# Patient Record
Sex: Female | Born: 1935 | Race: White | Hispanic: No | Marital: Single | Smoking: Never smoker
Health system: Southern US, Community
[De-identification: ages and names within clinical notes are randomized; demographics above are authoritative.]

## PROBLEM LIST (undated history)

## (undated) DIAGNOSIS — I6529 Occlusion and stenosis of unspecified carotid artery: Secondary | ICD-10-CM

## (undated) DIAGNOSIS — M1990 Unspecified osteoarthritis, unspecified site: Secondary | ICD-10-CM

## (undated) DIAGNOSIS — K219 Gastro-esophageal reflux disease without esophagitis: Secondary | ICD-10-CM

## (undated) DIAGNOSIS — R011 Cardiac murmur, unspecified: Secondary | ICD-10-CM

## (undated) DIAGNOSIS — R32 Unspecified urinary incontinence: Secondary | ICD-10-CM

## (undated) DIAGNOSIS — I2510 Atherosclerotic heart disease of native coronary artery without angina pectoris: Secondary | ICD-10-CM

## (undated) DIAGNOSIS — E7800 Pure hypercholesterolemia, unspecified: Secondary | ICD-10-CM

## (undated) DIAGNOSIS — I10 Essential (primary) hypertension: Secondary | ICD-10-CM

## (undated) DIAGNOSIS — Z9289 Personal history of other medical treatment: Secondary | ICD-10-CM

## (undated) DIAGNOSIS — D649 Anemia, unspecified: Secondary | ICD-10-CM

## (undated) HISTORY — PX: TUBAL LIGATION: SHX77

## (undated) HISTORY — PX: PERIPHERAL ARTERIAL STENT GRAFT: SHX2220

---

## 2014-06-16 ENCOUNTER — Emergency Department (HOSPITAL_COMMUNITY): Payer: Medicare Other

## 2014-06-16 ENCOUNTER — Inpatient Hospital Stay (HOSPITAL_COMMUNITY)
Admission: EM | Admit: 2014-06-16 | Discharge: 2014-06-20 | DRG: 312 | Disposition: A | Discharge status: Home / Self Care | Payer: Medicare Other | Attending: Internal Medicine | Admitting: Internal Medicine

## 2014-06-16 ENCOUNTER — Encounter (HOSPITAL_COMMUNITY): Payer: Self-pay | Admitting: Emergency Medicine

## 2014-06-16 DIAGNOSIS — R410 Disorientation, unspecified: Secondary | ICD-10-CM

## 2014-06-16 DIAGNOSIS — W19XXXS Unspecified fall, sequela: Secondary | ICD-10-CM

## 2014-06-16 DIAGNOSIS — D649 Anemia, unspecified: Secondary | ICD-10-CM | POA: Diagnosis present

## 2014-06-16 DIAGNOSIS — R55 Syncope and collapse: Secondary | ICD-10-CM | POA: Diagnosis present

## 2014-06-16 DIAGNOSIS — E7800 Pure hypercholesterolemia, unspecified: Secondary | ICD-10-CM | POA: Diagnosis present

## 2014-06-16 DIAGNOSIS — E049 Nontoxic goiter, unspecified: Secondary | ICD-10-CM

## 2014-06-16 DIAGNOSIS — Z7982 Long term (current) use of aspirin: Secondary | ICD-10-CM | POA: Diagnosis not present

## 2014-06-16 DIAGNOSIS — Z6834 Body mass index (BMI) 34.0-34.9, adult: Secondary | ICD-10-CM

## 2014-06-16 DIAGNOSIS — I2510 Atherosclerotic heart disease of native coronary artery without angina pectoris: Secondary | ICD-10-CM | POA: Diagnosis present

## 2014-06-16 DIAGNOSIS — E785 Hyperlipidemia, unspecified: Secondary | ICD-10-CM | POA: Diagnosis present

## 2014-06-16 DIAGNOSIS — I6529 Occlusion and stenosis of unspecified carotid artery: Secondary | ICD-10-CM | POA: Diagnosis present

## 2014-06-16 DIAGNOSIS — IMO0002 Reserved for concepts with insufficient information to code with codable children: Secondary | ICD-10-CM | POA: Diagnosis present

## 2014-06-16 DIAGNOSIS — I10 Essential (primary) hypertension: Secondary | ICD-10-CM | POA: Diagnosis present

## 2014-06-16 DIAGNOSIS — Z7901 Long term (current) use of anticoagulants: Secondary | ICD-10-CM | POA: Diagnosis not present

## 2014-06-16 DIAGNOSIS — W19XXXA Unspecified fall, initial encounter: Secondary | ICD-10-CM | POA: Insufficient documentation

## 2014-06-16 DIAGNOSIS — K219 Gastro-esophageal reflux disease without esophagitis: Secondary | ICD-10-CM | POA: Diagnosis present

## 2014-06-16 DIAGNOSIS — I779 Disorder of arteries and arterioles, unspecified: Secondary | ICD-10-CM | POA: Diagnosis present

## 2014-06-16 DIAGNOSIS — E041 Nontoxic single thyroid nodule: Secondary | ICD-10-CM | POA: Diagnosis present

## 2014-06-16 DIAGNOSIS — D6489 Other specified anemias: Secondary | ICD-10-CM | POA: Diagnosis present

## 2014-06-16 DIAGNOSIS — R791 Abnormal coagulation profile: Secondary | ICD-10-CM | POA: Diagnosis present

## 2014-06-16 DIAGNOSIS — I739 Peripheral vascular disease, unspecified: Secondary | ICD-10-CM | POA: Diagnosis present

## 2014-06-16 DIAGNOSIS — M129 Arthropathy, unspecified: Secondary | ICD-10-CM | POA: Diagnosis present

## 2014-06-16 DIAGNOSIS — E063 Autoimmune thyroiditis: Secondary | ICD-10-CM

## 2014-06-16 HISTORY — DX: Anemia, unspecified: D64.9

## 2014-06-16 HISTORY — DX: Gastro-esophageal reflux disease without esophagitis: K21.9

## 2014-06-16 HISTORY — DX: Essential (primary) hypertension: I10

## 2014-06-16 HISTORY — DX: Occlusion and stenosis of unspecified carotid artery: I65.29

## 2014-06-16 HISTORY — DX: Unspecified urinary incontinence: R32

## 2014-06-16 HISTORY — DX: Pure hypercholesterolemia, unspecified: E78.00

## 2014-06-16 HISTORY — DX: Unspecified osteoarthritis, unspecified site: M19.90

## 2014-06-16 HISTORY — DX: Atherosclerotic heart disease of native coronary artery without angina pectoris: I25.10

## 2014-06-16 HISTORY — DX: Cardiac murmur, unspecified: R01.1

## 2014-06-16 HISTORY — DX: Personal history of other medical treatment: Z92.89

## 2014-06-16 LAB — CBC
HCT: 35.5 % — ABNORMAL LOW (ref 36.0–46.0)
HEMOGLOBIN: 11.8 g/dL — AB (ref 12.0–15.0)
MCH: 29.2 pg (ref 26.0–34.0)
MCHC: 33.2 g/dL (ref 30.0–36.0)
MCV: 87.9 fL (ref 78.0–100.0)
PLATELETS: 182 10*3/uL (ref 150–400)
RBC: 4.04 MIL/uL (ref 3.87–5.11)
RDW: 14.9 % (ref 11.5–15.5)
WBC: 5.9 10*3/uL (ref 4.0–10.5)

## 2014-06-16 LAB — TSH: TSH: 2.95 u[IU]/mL (ref 0.350–4.500)

## 2014-06-16 LAB — COMPREHENSIVE METABOLIC PANEL
ALBUMIN: 3.8 g/dL (ref 3.5–5.2)
ALK PHOS: 143 U/L — AB (ref 39–117)
ALT: 21 U/L (ref 0–35)
AST: 28 U/L (ref 0–37)
Anion gap: 14 (ref 5–15)
BILIRUBIN TOTAL: 0.4 mg/dL (ref 0.3–1.2)
BUN: 20 mg/dL (ref 6–23)
CHLORIDE: 102 meq/L (ref 96–112)
CO2: 24 mEq/L (ref 19–32)
Calcium: 9.2 mg/dL (ref 8.4–10.5)
Creatinine, Ser: 0.88 mg/dL (ref 0.50–1.10)
GFR calc Af Amer: 72 mL/min — ABNORMAL LOW (ref >90)
GFR calc non Af Amer: 62 mL/min — ABNORMAL LOW (ref >90)
Glucose, Bld: 103 mg/dL — ABNORMAL HIGH (ref 70–99)
POTASSIUM: 3.8 meq/L (ref 3.7–5.3)
SODIUM: 140 meq/L (ref 137–147)
TOTAL PROTEIN: 6.8 g/dL (ref 6.0–8.3)

## 2014-06-16 LAB — DIFFERENTIAL
Basophils Absolute: 0 10*3/uL (ref 0.0–0.1)
Basophils Relative: 0 % (ref 0–1)
EOS ABS: 0.1 10*3/uL (ref 0.0–0.7)
Eosinophils Relative: 2 % (ref 0–5)
LYMPHS ABS: 2.5 10*3/uL (ref 0.7–4.0)
Lymphocytes Relative: 42 % (ref 12–46)
MONOS PCT: 5 % (ref 3–12)
Monocytes Absolute: 0.3 10*3/uL (ref 0.1–1.0)
NEUTROS ABS: 3 10*3/uL (ref 1.7–7.7)
NEUTROS PCT: 51 % (ref 43–77)

## 2014-06-16 LAB — PROTIME-INR
INR: 1.5 — AB (ref 0.00–1.49)
PROTHROMBIN TIME: 18.1 s — AB (ref 11.6–15.2)

## 2014-06-16 LAB — I-STAT TROPONIN, ED: Troponin i, poc: 0.01 ng/mL (ref 0.00–0.08)

## 2014-06-16 LAB — CBG MONITORING, ED: Glucose-Capillary: 96 mg/dL (ref 70–99)

## 2014-06-16 LAB — APTT: APTT: 32 s (ref 24–37)

## 2014-06-16 LAB — TROPONIN I: Troponin I: <0.3 ng/mL (ref <0.30)

## 2014-06-16 MED ORDER — SODIUM CHLORIDE 0.9 % IV SOLN: 250.0000 mL | INTRAVENOUS | Status: DC | PRN

## 2014-06-16 MED ORDER — OMEGA-3-ACID ETHYL ESTERS 1 G PO CAPS
2.0000 g | ORAL_CAPSULE | Freq: Two times a day (BID) | ORAL | Status: DC
  Administered 2014-06-16 – 2014-06-20 (×8): 2 g via ORAL
  Filled 2014-06-16 (×9): qty 2

## 2014-06-16 MED ORDER — ACETAMINOPHEN 650 MG RE SUPP: 650.0000 mg | Freq: Four times a day (QID) | RECTAL | Status: DC | PRN

## 2014-06-16 MED ORDER — CARVEDILOL 12.5 MG PO TABS
12.5000 mg | ORAL_TABLET | Freq: Every day | ORAL | Status: DC
  Administered 2014-06-16 – 2014-06-17 (×2): 12.5 mg via ORAL
  Filled 2014-06-16 (×2): qty 1

## 2014-06-16 MED ORDER — IRBESARTAN 300 MG PO TABS
300.0000 mg | ORAL_TABLET | Freq: Every day | ORAL | Status: DC
  Administered 2014-06-16 – 2014-06-20 (×5): 300 mg via ORAL
  Filled 2014-06-16 (×5): qty 1

## 2014-06-16 MED ORDER — SODIUM CHLORIDE 0.9 % IJ SOLN: 3.0000 mL | INTRAMUSCULAR | Status: DC | PRN

## 2014-06-16 MED ORDER — COLESEVELAM HCL 625 MG PO TABS
1250.0000 mg | ORAL_TABLET | Freq: Every day | ORAL | Status: DC
  Administered 2014-06-16 – 2014-06-20 (×5): 1250 mg via ORAL
  Filled 2014-06-16 (×5): qty 2

## 2014-06-16 MED ORDER — CALCIUM CARBONATE 600 MG PO TABS: 600.0000 mg | ORAL_TABLET | Freq: Every day | ORAL | Status: DC

## 2014-06-16 MED ORDER — ACETAMINOPHEN 325 MG PO TABS
650.0000 mg | ORAL_TABLET | Freq: Four times a day (QID) | ORAL | Status: DC | PRN
  Administered 2014-06-18 – 2014-06-19 (×2): 650 mg via ORAL
  Filled 2014-06-16 (×2): qty 2

## 2014-06-16 MED ORDER — HYDROCHLOROTHIAZIDE 25 MG PO TABS
25.0000 mg | ORAL_TABLET | Freq: Every day | ORAL | Status: DC
  Administered 2014-06-16 – 2014-06-18 (×3): 25 mg via ORAL
  Filled 2014-06-16 (×3): qty 1

## 2014-06-16 MED ORDER — SODIUM CHLORIDE 0.9 % IJ SOLN
3.0000 mL | Freq: Two times a day (BID) | INTRAMUSCULAR | Status: DC
Start: 2014-06-16 — End: 2014-06-20
  Administered 2014-06-16 – 2014-06-18 (×4): 3 mL via INTRAVENOUS

## 2014-06-16 MED ORDER — CALCIUM CARBONATE 1250 (500 CA) MG PO TABS
1.0000 | ORAL_TABLET | Freq: Every day | ORAL | Status: DC
  Administered 2014-06-17 – 2014-06-20 (×4): 500 mg via ORAL
  Filled 2014-06-16 (×5): qty 1

## 2014-06-16 MED ORDER — ALBUTEROL SULFATE (2.5 MG/3ML) 0.083% IN NEBU: 2.5000 mg | INHALATION_SOLUTION | RESPIRATORY_TRACT | Status: DC | PRN

## 2014-06-16 MED ORDER — EZETIMIBE 10 MG PO TABS
10.0000 mg | ORAL_TABLET | Freq: Every day | ORAL | Status: DC
  Administered 2014-06-16 – 2014-06-20 (×5): 10 mg via ORAL
  Filled 2014-06-16 (×5): qty 1

## 2014-06-16 MED ORDER — CYANOCOBALAMIN 500 MCG PO TABS
500.0000 ug | ORAL_TABLET | Freq: Every day | ORAL | Status: DC
  Administered 2014-06-16: 500 ug via ORAL
  Administered 2014-06-17: 11:00:00 via ORAL
  Administered 2014-06-18 – 2014-06-20 (×3): 500 ug via ORAL
  Filled 2014-06-16 (×5): qty 1

## 2014-06-16 MED ORDER — WARFARIN - PHARMACIST DOSING INPATIENT: Freq: Every day | Status: DC

## 2014-06-16 MED ORDER — PANTOPRAZOLE SODIUM 40 MG PO TBEC
40.0000 mg | DELAYED_RELEASE_TABLET | Freq: Every day | ORAL | Status: DC
  Administered 2014-06-16 – 2014-06-20 (×5): 40 mg via ORAL
  Filled 2014-06-16 (×4): qty 1

## 2014-06-16 MED ORDER — ASPIRIN EC 81 MG PO TBEC
81.0000 mg | DELAYED_RELEASE_TABLET | Freq: Every day | ORAL | Status: DC
  Administered 2014-06-16 – 2014-06-20 (×5): 81 mg via ORAL
  Filled 2014-06-16 (×5): qty 1

## 2014-06-16 MED ORDER — SODIUM CHLORIDE 0.9 % IJ SOLN
3.0000 mL | Freq: Two times a day (BID) | INTRAMUSCULAR | Status: DC
  Administered 2014-06-16 – 2014-06-20 (×6): 3 mL via INTRAVENOUS

## 2014-06-16 MED ORDER — VITAMIN B-12 500 MCG PO TABS: 500.0000 ug | ORAL_TABLET | Freq: Every day | ORAL | Status: DC

## 2014-06-16 MED ORDER — WARFARIN SODIUM 10 MG PO TABS
10.0000 mg | ORAL_TABLET | Freq: Once | ORAL | Status: AC
Start: 2014-06-16 — End: 2014-06-16
  Administered 2014-06-16: 10 mg via ORAL
  Filled 2014-06-16: qty 1

## 2014-06-16 MED ORDER — FERROUS SULFATE 325 (65 FE) MG PO TABS
325.0000 mg | ORAL_TABLET | ORAL | Status: DC
  Filled 2014-06-16 (×2): qty 1

## 2014-06-16 MED ORDER — ATORVASTATIN CALCIUM 80 MG PO TABS
80.0000 mg | ORAL_TABLET | Freq: Every day | ORAL | Status: DC
Start: 2014-06-17 — End: 2014-06-20
  Administered 2014-06-17 – 2014-06-20 (×4): 80 mg via ORAL
  Filled 2014-06-16 (×4): qty 1

## 2014-06-16 NOTE — ED Notes (Signed)
C collar remains in place; pt having some difficulty recalling events before fall.

## 2014-06-16 NOTE — ED Notes (Signed)
MD at bedside; pt still having neck tenderness; aspen collar placed per MD request.

## 2014-06-16 NOTE — ED Notes (Signed)
Portable xray at bedside.

## 2014-06-16 NOTE — Code Documentation (Signed)
79[facility] at 1212.  EMS reports that the [place] and walked into a glass door and fell backwards striking her head on the pavement.  On EMS arrival patient was able to answer questions appropriately, but en route patient became confused.  Code stroke activated.  Patient nauseated en route requiring Zofran 4mg  and SBP in the 200s per EMS.  Patient reports that she takes Coumadin.  EDP at bedside, patient to go to trauma room prior to CT.  Code stroke canceled by Dr. Loretha StaplerWofford at 1220.  Bedside handoff with ED RN Lanora ManisElizabeth.

## 2014-06-16 NOTE — ED Provider Notes (Signed)
CSN: [id number]     Arrival date & time 06/16/14  1214 History   First MD Initiated Contact with Patient 06/16/14 1229     Chief Complaint  Patient presents with  . Fall   Ana Chapman is a 79 yo caucasian F who presents today after fall unwitnessed. The patient was brought in by EMS and in route became more confused. Pt is known to be on coumadin. Other medical hx unknown. She endorses HA and neck pain, but no other complaints. Does not recall falling and is unsure what happened. Does not believe she felt odd or ill before the event. Not prone to falls.   She denies CP, SOB, fever, chills, constipation, hematemesis, dysuria, hematuria, sick contacts, or recent travel.   (Consider location/radiation/quality/duration/timing/severity/associated sxs/prior Treatment) Patient is a 79 y.o. female presenting with fall.  Fall This is a new problem. The current episode started today. Associated symptoms include headaches. Pertinent negatives include no abdominal pain, chest pain, chills, coughing, diaphoresis, fever, nausea, numbness, urinary symptoms, vomiting or weakness. Nothing aggravates the symptoms. She has tried nothing for the symptoms.    Past Medical History  Diagnosis Date  . Coronary artery disease   . Hypertension    History reviewed. No pertinent past surgical history. History reviewed. No pertinent family history. History  Substance Use Topics  . Smoking status: Not on file  . Smokeless tobacco: Not on file  . Alcohol Use: Not on file   OB History   Grav Para Term Preterm Abortions TAB SAB Ect Mult Living                 Review of Systems  Unable to perform ROS Constitutional: Negative for fever, chills and diaphoresis.  Respiratory: Negative for cough and shortness of breath.   Cardiovascular: Negative for chest pain, palpitations and leg swelling.  Gastrointestinal: Negative for nausea, vomiting, abdominal pain, diarrhea, constipation and abdominal distention.   Genitourinary: Negative for dysuria, frequency, flank pain and decreased urine volume.  Skin: Negative for wound.  Neurological: Positive for headaches. Negative for dizziness, facial asymmetry, speech difficulty, weakness, light-headedness and numbness.  All other systems reviewed and are negative.     Allergies  Contrast media and Latex  Home Medications   Prior to Admission medications   Medication Sig Start Date End Date Taking? Authorizing Provider  alendronate (FOSAMAX) 70 MG tablet Take 70 mg by mouth once a week. 06/10/14  Yes Historical Provider, MD  aspirin EC 81 MG tablet Take 81 mg by mouth daily.   Yes Historical Provider, MD  calcium carbonate (OS-CAL) 600 MG TABS tablet Take 600 mg by mouth daily.   Yes Historical Provider, MD  carvedilol (COREG) 12.5 MG tablet Take 12.5 mg by mouth daily.   Yes Historical Provider, MD  celecoxib (CELEBREX) 200 MG capsule Take 200 mg by mouth daily. 04/11/14  Yes Historical Provider, MD  colesevelam (WELCHOL) 625 MG tablet Take 1,250 mg by mouth daily.   Yes Historical Provider, MD  CRESTOR 40 MG tablet Take 40 mg by mouth 2 (two) times daily. 06/11/14  Yes Historical Provider, MD  esomeprazole (NEXIUM) 40 MG capsule Take 40 mg by mouth daily at 12 noon.   Yes Historical Provider, MD  ezetimibe (ZETIA) 10 MG tablet Take 10 mg by mouth daily.   Yes Historical Provider, MD  ferrous sulfate 325 (65 FE) MG tablet Take 325 mg by mouth 2 (two) times a week.   Yes Historical Provider, MD  hydrochlorothiazide (HYDRODIURIL) 25  MG tablet Take 25 mg by mouth daily.   Yes Historical Provider, MD  Multiple Vitamin (VITAMIN LIQUID PO) Take 5 mLs by mouth daily.   Yes Historical Provider, MD  olmesartan (BENICAR) 40 MG tablet Take 40 mg by mouth daily.   Yes Historical Provider, MD  omega-3 acid ethyl esters (LOVAZA) 1 G capsule Take 2 g by mouth 2 (two) times daily.   Yes Historical Provider, MD  potassium chloride (K-DUR,KLOR-CON) 10 MEQ tablet Take 10  mEq by mouth 2 (two) times daily.   Yes Historical Provider, MD  vitamin B-12 (CYANOCOBALAMIN) 500 MCG tablet Take 500 mcg by mouth daily.   Yes Historical Provider, MD  Vitamin D, Ergocalciferol, (DRISDOL) 50000 UNITS CAPS capsule Take 50,000 Units by mouth every 7 (seven) days.   Yes Historical Provider, MD  warfarin (COUMADIN) 5 MG tablet Take 5-7.5 mg by mouth daily. Take 5mg  by mouth on Monday and Friday. Take 7.5mg  by mouth on Sunday, Tuesday, Wednesday, Thursday and Saturday.   Yes Historical Provider, MD   BP 180/67  Pulse 69  Temp(Src) 98.2 F (36.8 C) (Oral)  Resp 20  Ht 4\' 8"  (1.422 m)  Wt 152 lb 3.2 oz (69.037 kg)  BMI 34.14 kg/m2  SpO2 97% Physical Exam  Nursing note and vitals reviewed. Constitutional: She appears well-developed and well-nourished. No distress.  HENT:  Head: Normocephalic and atraumatic.  Cardiovascular: Normal rate, regular rhythm, normal heart sounds and intact distal pulses.  Exam reveals no gallop and no friction rub.   No murmur heard. Pulmonary/Chest: Effort normal and breath sounds normal. No respiratory distress. She has no wheezes. She has no rales. She exhibits tenderness (right anterior chest wall, no brusing).  Abdominal: Soft. Bowel sounds are normal. She exhibits no distension and no mass. There is no tenderness. There is no rebound and no guarding.  Lymphadenopathy:    She has no cervical adenopathy.  Neurological: She is alert. No cranial nerve deficit.  Slow to respond, confused  Skin: Skin is warm and dry. No rash noted. She is not diaphoretic. No erythema.    ED Course  Procedures (including critical care time) Labs Review Labs Reviewed  PROTIME-INR - Abnormal; Notable for the following:    Prothrombin Time 18.1 (*)    INR 1.50 (*)    All other components within normal limits  CBC - Abnormal; Notable for the following:    Hemoglobin 11.8 (*)    HCT 35.5 (*)    All other components within normal limits  COMPREHENSIVE METABOLIC  PANEL - Abnormal; Notable for the following:    Glucose, Bld 103 (*)    Alkaline Phosphatase 143 (*)    GFR calc non Af Amer 62 (*)    GFR calc Af Amer 72 (*)    All other components within normal limits  APTT  DIFFERENTIAL  CBG MONITORING, ED  I-STAT TROPOININ, ED    Imaging Review Ct Head Wo Contrast  06/16/2014   CLINICAL DATA:  Post fall, on Coumadin, now with confusion  EXAM: CT HEAD WITHOUT CONTRAST  CT CERVICAL SPINE WITHOUT CONTRAST  TECHNIQUE: Multidetector CT imaging of the head and cervical spine was performed following the standard protocol without intravenous contrast. Multiplanar CT image reconstructions of the cervical spine were also generated.  COMPARISON:  Head CT - 07/10/2008  FINDINGS: CT HEAD FINDINGS  Advanced atrophy with diffuse sulcal prominence and centralized volume loss with mild commensurate ex vacuo dilatation of the ventricular system, likely progressed in the interval.  Scattered minimal periventricular hypodensities compatible with microvascular ischemic disease. Bilateral basal ganglial calcifications. The gray-white differentiation is otherwise well maintained without CT evidence of acute large territory infarct. No intraparenchymal or extra-axial mass or hemorrhage. Normal configuration of the ventricles and basilar cisterns. No midline shift. There is under pneumatization of the bilateral frontal sinuses. The remaining paranasal sinuses and mastoid air cells are normally aerated. No air-fluid levels. Regional soft tissues appear normal. No displaced calvarial fracture.  CT CERVICAL SPINE FINDINGS  C1 to the superior endplate of T3 is imaged.  There is mild (approximately 2 mm) of anterolisthesis of C5 upon C6. The bilateral facets are normally aligned. The dens is normally positioned between the lateral masses of C1. Moderate degenerative change of the atlanto dental articulation. A tiny accessory ossicle is noted about the caudal aspect of the anterior arch of C1.  Normal atlanto-axial articulations. Incidental note is made of partial ossification of the anterior longitudinal ligament anterior to the C2-C3 intervertebral disc space.  No fracture or static subluxation of the cervical spine. Cervical vertebral body heights are preserved. Prevertebral soft tissues are normal.  There is mild to moderate multilevel DDD throughout the cervical spine, likely worse at C4-C5 and C6-C7 with disc space height loss, endplate irregularity and sclerosis.  There is diffuse enlargement of the thyroid gland with a suspected approximately 2.7 x 1.6 cm partially calcified left-sided thyroid nodule (image 54, series 301 as well as an ill-defined approximately 1.9 x 1.8 cm hypo attenuating nodule within the thyroid isthmus (image 61, series 302) and an ill-defined approximately 3.0 x 2.4 cm nodule within the right lobe of the thyroid (image 63, series 302).  Atherosclerotic plaque within the bilateral carotid bulbs, left greater than right. No bulky cervical adenopathy on this noncontrast examination. Limited visualization of lung apices is normal.  IMPRESSION: 1. Advanced atrophy and mild microvascular ischemic disease without acute intracranial process. 2. No fracture or static subluxation of the cervical spine. 3. Mild to moderate multilevel DDD throughout the cervical spine, likely worse at C4-C5 and C6-C7. 4. Diffusely enlarged thyroid gland with multiple bilateral indeterminate thyroid nodules. Further evaluation with nonemergent thyroid ultrasound could be performed as clinically indicated.   Electronically Signed   By: Simonne Come M.D.   On: 06/16/2014 13:34   Ct Cervical Spine Wo Contrast  06/16/2014   CLINICAL DATA:  Post fall, on Coumadin, now with confusion  EXAM: CT HEAD WITHOUT CONTRAST  CT CERVICAL SPINE WITHOUT CONTRAST  TECHNIQUE: Multidetector CT imaging of the head and cervical spine was performed following the standard protocol without intravenous contrast. Multiplanar CT  image reconstructions of the cervical spine were also generated.  COMPARISON:  Head CT - 07/10/2008  FINDINGS: CT HEAD FINDINGS  Advanced atrophy with diffuse sulcal prominence and centralized volume loss with mild commensurate ex vacuo dilatation of the ventricular system, likely progressed in the interval. Scattered minimal periventricular hypodensities compatible with microvascular ischemic disease. Bilateral basal ganglial calcifications. The gray-white differentiation is otherwise well maintained without CT evidence of acute large territory infarct. No intraparenchymal or extra-axial mass or hemorrhage. Normal configuration of the ventricles and basilar cisterns. No midline shift. There is under pneumatization of the bilateral frontal sinuses. The remaining paranasal sinuses and mastoid air cells are normally aerated. No air-fluid levels. Regional soft tissues appear normal. No displaced calvarial fracture.  CT CERVICAL SPINE FINDINGS  C1 to the superior endplate of T3 is imaged.  There is mild (approximately 2 mm) of anterolisthesis of C5 upon C6.  The bilateral facets are normally aligned. The dens is normally positioned between the lateral masses of C1. Moderate degenerative change of the atlanto dental articulation. A tiny accessory ossicle is noted about the caudal aspect of the anterior arch of C1. Normal atlanto-axial articulations. Incidental note is made of partial ossification of the anterior longitudinal ligament anterior to the C2-C3 intervertebral disc space.  No fracture or static subluxation of the cervical spine. Cervical vertebral body heights are preserved. Prevertebral soft tissues are normal.  There is mild to moderate multilevel DDD throughout the cervical spine, likely worse at C4-C5 and C6-C7 with disc space height loss, endplate irregularity and sclerosis.  There is diffuse enlargement of the thyroid gland with a suspected approximately 2.7 x 1.6 cm partially calcified left-sided thyroid  nodule (image 54, series 301 as well as an ill-defined approximately 1.9 x 1.8 cm hypo attenuating nodule within the thyroid isthmus (image 61, series 302) and an ill-defined approximately 3.0 x 2.4 cm nodule within the right lobe of the thyroid (image 63, series 302).  Atherosclerotic plaque within the bilateral carotid bulbs, left greater than right. No bulky cervical adenopathy on this noncontrast examination. Limited visualization of lung apices is normal.  IMPRESSION: 1. Advanced atrophy and mild microvascular ischemic disease without acute intracranial process. 2. No fracture or static subluxation of the cervical spine. 3. Mild to moderate multilevel DDD throughout the cervical spine, likely worse at C4-C5 and C6-C7. 4. Diffusely enlarged thyroid gland with multiple bilateral indeterminate thyroid nodules. Further evaluation with nonemergent thyroid ultrasound could be performed as clinically indicated.   Electronically Signed   By: Simonne Come M.D.   On: 06/16/2014 13:34   Dg Chest Port 1 View  06/16/2014   CLINICAL DATA:  Recent traumatic injury with pain  EXAM: PORTABLE CHEST - 1 VIEW  COMPARISON:  09/17/2008  FINDINGS: The heart size and mediastinal contours are within normal limits. Both lungs are clear. The visualized skeletal structures are unremarkable. An old left clavicular fracture is noted.  IMPRESSION: No active disease.   Electronically Signed   By: Alcide Clever M.D.   On: 06/16/2014 14:29     EKG Interpretation None      MDM   79 yo caucasian F here after fall, on coumadin. Concern for ICH or neck fracture. Pt is in C-collar. Answering most questions, but having a hard time recalling things. VSS, AF, NAD. No external signs of inury. TMs clear bilaterally, no sign of basilar skull fracture. No external skull trauma.  Will obtain head CT WO contrast, C-spine CT, CXR, EKG, CBC, CMP, PT/INR, trop. Trop detectable at 0.01, but no signs of ischemia or arrhythmia on EKG. Therapeutic on  coumadin at 1.5. CXR w/no cardiopulmonary abnormalities or rib fracture. Suspect anterior right chest pain on palpation due to bruising. CT head and C-spine w/no fracture or ICH. However, on attempt to clear collar, pt endorses cervical spine TTP. Therefore, placed in an aspen collar.   At this time, pt will be admitted to hospital. Please see their note for further details regarding the remainder of her hospital course. Throughout her time in the ED, pt remained stable and required no acute interventions.    Final diagnoses:  Fall, initial encounter  Disorientation    Pt was seen under the supervision of Dr. Loretha Stapler.     Rachelle Hora, MD 06/16/14 [phone]

## 2014-06-16 NOTE — ED Notes (Signed)
PT at bus stop; ran into glass door; fell backwards onto head. Initially GCS 15, in route became confused. GCS 14. Pt on coumadin. Denies other injuries.

## 2014-06-16 NOTE — H&P (Signed)
Date: 06/16/2014               Patient Name:  Ana Chapman MRN: [id number]  DOB: [birth year] Age / Sex: 79 y.o., female   PCP: No primary provider on file.              Medical Service: Internal Medicine Teaching Service              Attending Physician: Dr. Judyann Munson, MD    First Contact: Ana Chapman, MS4 Pager: [phone]  Second Contact: Dr. Garald Chapman Pager: [phone]  Third Contact Dr.  Pager: [phone]       After Hours (After 5p/  First Contact Pager: [phone]  weekends / holidays): Second Contact Pager: [phone]   Chief Complaint: Fall  History of Present Illness: Ana Chapman is a 79 yo female with cardiomegaly, 80% left carotid stenosis, and PVD who presented to the ED after a fall. The patient reports losing consciousness before the episode, but EMT told her she fell into the bus door and then backwards hitting her head on the concrete. She remembers being assisted by medics into the ambulance but was confused as to what had happened and where she was. She now reports remembering getting to the bus stop. She states she did not lose bladder control and no one who saw the incident reported jerking movements. She states she drank a diet coke and ate a few potato chips today prior to the event. She denies dizziness, lightheadedness, tunnel vision, and nausea prior to the event. She does report recent palpitations, shortness of breath, and chest tightening/pain, which is usually relieved with rest after 2-3 hours. Patient functions independently and denies similar past episodes.   Upon arrival to ED, CT head w/o contrast performed. No acute intracranial process noted.  Ana Chapman is seen by Ana Lent, PA at [facility]. Unable to obtain records.   Meds: Current Facility-Administered Medications  Medication Dose Route Frequency Provider Last Rate Last Dose  . 0.9 %  sodium chloride infusion  250 mL Intravenous PRN Ky Barban, MD      . acetaminophen (TYLENOL) tablet  650 mg  650 mg Oral Q6H PRN Ky Barban, MD       Or  . acetaminophen (TYLENOL) suppository 650 mg  650 mg Rectal Q6H PRN Ky Barban, MD      . albuterol (PROVENTIL) (2.5 MG/3ML) 0.083% nebulizer solution 2.5 mg  2.5 mg Nebulization Q2H PRN Ky Barban, MD      . aspirin EC tablet 81 mg  81 mg Oral Daily Ky Barban, MD      . Melene Muller ON 06/17/2014] atorvastatin (LIPITOR) tablet 80 mg  80 mg Oral q1800 Ky Barban, MD      . Melene Muller ON 06/17/2014] calcium carbonate (OS-CAL - dosed in mg of elemental calcium) tablet 500 mg of elemental calcium  1 tablet Oral Q breakfast Ana Munson, MD      . carvedilol (COREG) tablet 12.5 mg  12.5 mg Oral Daily Ky Barban, MD      . colesevelam [facility]) tablet 1,250 mg  1,250 mg Oral Daily Ky Barban, MD      . cyanocobalamin tablet 500 mcg  500 mcg Oral Daily Ana Munson, MD      . ezetimibe (ZETIA) tablet 10 mg  10 mg Oral Daily Ky Barban, MD      . Melene Muller ON 06/18/2014] ferrous sulfate tablet 325 mg  325 mg  Oral Once per day on Mon Thu Solianny D Kennerly, MD      . hydrochlorothiazide (HYDRODIURIL) tablet 25 mg  25 mg Oral Daily Ky Barban, MD      . irbesartan (AVAPRO) tablet 300 mg  300 mg Oral Daily Ky Barban, MD      . omega-3 acid ethyl esters (LOVAZA) capsule 2 g  2 g Oral BID Ky Barban, MD      . pantoprazole (PROTONIX) EC tablet 40 mg  40 mg Oral Daily Ky Barban, MD      . sodium chloride 0.9 % injection 3 mL  3 mL Intravenous Q12H Ky Barban, MD      . sodium chloride 0.9 % injection 3 mL  3 mL Intravenous Q12H Ky Barban, MD      . sodium chloride 0.9 % injection 3 mL  3 mL Intravenous PRN Ky Barban, MD        Allergies: Allergies as of 06/16/2014 - Review Complete 06/16/2014  Allergen Reaction Noted  . Contrast media [iodinated diagnostic agents] Itching, Rash, and Other (See Comments) 06/16/2014  . Latex  Itching and Rash 06/16/2014   Past Medical History  Diagnosis Date  . Coronary artery disease   . Hypertension   . Carotid stenosis     "left side 80% blocked"  . High cholesterol   . Heart murmur   . GERD (gastroesophageal reflux disease)   . Anemia   . History of blood transfusion     "related to RLE OR"    Past Surgical History  Procedure Laterality Date  . Peripheral arterial stent graft Bilateral     "RLE was 100% blocked"  . Tubal ligation  ~ 1962   History reviewed. No pertinent family history. History   Social History  . Marital Status: Single    Spouse Name: N/A    Number of Children: N/A  . Years of Education: N/A   Occupational History  . Not on file.   Social History Main Topics  . Smoking status: Non-smoker  . Smokeless tobacco: Tried dip once  . Alcohol Use: None  . Drug Use: None  . Sexual Activity: Not on file   Other Topics Concern  . Not on file   Social History Narrative  . No narrative on file    Review of Systems: Positive: SOB, chest pain, and palpitations Negative: dizziness, vision changes, nausea, lightheadedness  Physical Exam: Blood pressure 180/67, pulse 69, temperature 98.2 F (36.8 C), temperature source Oral, resp. rate 20, height 4\' 8"  (1.422 m), weight 69.037 kg (152 lb 3.2 oz), SpO2 97.00%. Constitutional: She is oriented to person, place, and time. Wearing C-spine, watching TV. HEENT:  Head: Normocephalic, C-spine ENT: Conjunctivae are normal. Pupils are equal, round, and reactive to light. MMM. Cardiovascular: Normal rate, regular rhythm. Systolic heart murmur. No gallops or friction rub. 2+ radial and pedal pulses bilaterally. No JVD. Mid-sternum tender to palpation. Pulmonary/Chest: Effort normal. No respiratory distress. CTAB, no wheezes. Abdominal: Soft. Bowel sounds are normal. No distension or tenderness. Neurological: She is alert and oriented to person, place, and time. CN II-XII grossly intact.  Ext: 5/5 upper  and lower extremity strength. 5/5 grip strength. No peripheral edema.   Lab results: Basic Metabolic Panel:  Recent Labs  16/10/96 1215  NA 140  K 3.8  CL 102  CO2 24  GLUCOSE 103*  BUN 20  CREATININE 0.88  CALCIUM 9.2   Liver Function Tests:  Recent Labs  06/16/14 1215  AST 28  ALT 21  ALKPHOS 143*  BILITOT 0.4  PROT 6.8  ALBUMIN 3.8   CBC:  Recent Labs  06/16/14 1215  WBC 5.9  NEUTROABS 3.0  HGB 11.8*  HCT 35.5*  MCV 87.9  PLT 182   Cardiac Enzymes: No results found for this basename: CKTOTAL, CKMB, CKMBINDEX, TROPONINI,  in the last 72 hours BNP: No results found for this basename: PROBNP,  in the last 72 hours D-Dimer: No results found for this basename: DDIMER,  in the last 72 hours CBG:  Recent Labs  06/16/14 1244  GLUCAP 96   Coagulation:  Recent Labs  06/16/14 1215  LABPROT 18.1*  INR 1.50*   Urine Drug Screen: Drugs of Abuse  No results found for this basename: labopia, cocainscrnur, labbenz, amphetmu, thcu, labbarb    Imaging results:  Ct Head Wo Contrast  06/16/2014   CLINICAL DATA:  Post fall, on Coumadin, now with confusion  EXAM: CT HEAD WITHOUT CONTRAST  CT CERVICAL SPINE WITHOUT CONTRAST  TECHNIQUE: Multidetector CT imaging of the head and cervical spine was performed following the standard protocol without intravenous contrast. Multiplanar CT image reconstructions of the cervical spine were also generated.  COMPARISON:  Head CT - 07/10/2008  FINDINGS: CT HEAD FINDINGS  Advanced atrophy with diffuse sulcal prominence and centralized volume loss with mild commensurate ex vacuo dilatation of the ventricular system, likely progressed in the interval. Scattered minimal periventricular hypodensities compatible with microvascular ischemic disease. Bilateral basal ganglial calcifications. The gray-white differentiation is otherwise well maintained without CT evidence of acute large territory infarct. No intraparenchymal or extra-axial mass  or hemorrhage. Normal configuration of the ventricles and basilar cisterns. No midline shift. There is under pneumatization of the bilateral frontal sinuses. The remaining paranasal sinuses and mastoid air cells are normally aerated. No air-fluid levels. Regional soft tissues appear normal. No displaced calvarial fracture.  CT CERVICAL SPINE FINDINGS  C1 to the superior endplate of T3 is imaged.  There is mild (approximately 2 mm) of anterolisthesis of C5 upon C6. The bilateral facets are normally aligned. The dens is normally positioned between the lateral masses of C1. Moderate degenerative change of the atlanto dental articulation. A tiny accessory ossicle is noted about the caudal aspect of the anterior arch of C1. Normal atlanto-axial articulations. Incidental note is made of partial ossification of the anterior longitudinal ligament anterior to the C2-C3 intervertebral disc space.  No fracture or static subluxation of the cervical spine. Cervical vertebral body heights are preserved. Prevertebral soft tissues are normal.  There is mild to moderate multilevel DDD throughout the cervical spine, likely worse at C4-C5 and C6-C7 with disc space height loss, endplate irregularity and sclerosis.  There is diffuse enlargement of the thyroid gland with a suspected approximately 2.7 x 1.6 cm partially calcified left-sided thyroid nodule (image 54, series 301 as well as an ill-defined approximately 1.9 x 1.8 cm hypo attenuating nodule within the thyroid isthmus (image 61, series 302) and an ill-defined approximately 3.0 x 2.4 cm nodule within the right lobe of the thyroid (image 63, series 302).  Atherosclerotic plaque within the bilateral carotid bulbs, left greater than right. No bulky cervical adenopathy on this noncontrast examination. Limited visualization of lung apices is normal.  IMPRESSION: 1. Advanced atrophy and mild microvascular ischemic disease without acute intracranial process. 2. No fracture or static  subluxation of the cervical spine. 3. Mild to moderate multilevel DDD throughout the cervical spine, likely worse at C4-C5  and C6-C7. 4. Diffusely enlarged thyroid gland with multiple bilateral indeterminate thyroid nodules. Further evaluation with nonemergent thyroid ultrasound could be performed as clinically indicated.   Electronically Signed   By: Simonne Come M.D.   On: 06/16/2014 13:34   Ct Cervical Spine Wo Contrast  06/16/2014   CLINICAL DATA:  Post fall, on Coumadin, now with confusion  EXAM: CT HEAD WITHOUT CONTRAST  CT CERVICAL SPINE WITHOUT CONTRAST  TECHNIQUE: Multidetector CT imaging of the head and cervical spine was performed following the standard protocol without intravenous contrast. Multiplanar CT image reconstructions of the cervical spine were also generated.  COMPARISON:  Head CT - 07/10/2008  FINDINGS: CT HEAD FINDINGS  Advanced atrophy with diffuse sulcal prominence and centralized volume loss with mild commensurate ex vacuo dilatation of the ventricular system, likely progressed in the interval. Scattered minimal periventricular hypodensities compatible with microvascular ischemic disease. Bilateral basal ganglial calcifications. The gray-white differentiation is otherwise well maintained without CT evidence of acute large territory infarct. No intraparenchymal or extra-axial mass or hemorrhage. Normal configuration of the ventricles and basilar cisterns. No midline shift. There is under pneumatization of the bilateral frontal sinuses. The remaining paranasal sinuses and mastoid air cells are normally aerated. No air-fluid levels. Regional soft tissues appear normal. No displaced calvarial fracture.  CT CERVICAL SPINE FINDINGS  C1 to the superior endplate of T3 is imaged.  There is mild (approximately 2 mm) of anterolisthesis of C5 upon C6. The bilateral facets are normally aligned. The dens is normally positioned between the lateral masses of C1. Moderate degenerative change of the  atlanto dental articulation. A tiny accessory ossicle is noted about the caudal aspect of the anterior arch of C1. Normal atlanto-axial articulations. Incidental note is made of partial ossification of the anterior longitudinal ligament anterior to the C2-C3 intervertebral disc space.  No fracture or static subluxation of the cervical spine. Cervical vertebral body heights are preserved. Prevertebral soft tissues are normal.  There is mild to moderate multilevel DDD throughout the cervical spine, likely worse at C4-C5 and C6-C7 with disc space height loss, endplate irregularity and sclerosis.  There is diffuse enlargement of the thyroid gland with a suspected approximately 2.7 x 1.6 cm partially calcified left-sided thyroid nodule (image 54, series 301 as well as an ill-defined approximately 1.9 x 1.8 cm hypo attenuating nodule within the thyroid isthmus (image 61, series 302) and an ill-defined approximately 3.0 x 2.4 cm nodule within the right lobe of the thyroid (image 63, series 302).  Atherosclerotic plaque within the bilateral carotid bulbs, left greater than right. No bulky cervical adenopathy on this noncontrast examination. Limited visualization of lung apices is normal.  IMPRESSION: 1. Advanced atrophy and mild microvascular ischemic disease without acute intracranial process. 2. No fracture or static subluxation of the cervical spine. 3. Mild to moderate multilevel DDD throughout the cervical spine, likely worse at C4-C5 and C6-C7. 4. Diffusely enlarged thyroid gland with multiple bilateral indeterminate thyroid nodules. Further evaluation with nonemergent thyroid ultrasound could be performed as clinically indicated.   Electronically Signed   By: Simonne Come M.D.   On: 06/16/2014 13:34   Dg Chest Port 1 View  06/16/2014   CLINICAL DATA:  Recent traumatic injury with pain  EXAM: PORTABLE CHEST - 1 VIEW  COMPARISON:  09/17/2008  FINDINGS: The heart size and mediastinal contours are within normal limits.  Both lungs are clear. The visualized skeletal structures are unremarkable. An old left clavicular fracture is noted.  IMPRESSION: No active disease.  Electronically Signed   By: Alcide CleverMark  Lukens M.D.   On: 06/16/2014 14:29    Other results: EKG: pending  Assessment & Plan by Problem: Active Problems:   Fall  Ms. Hart RochesterLawson is a 79 yo female with cardiomegaly, 80% carotid stenosis, and PVD admitted after a syncopal event.   Syncope: CT head 06/16/14-no active intracranial process CMP Troponins x 3 Telemetry Orthostatics Daily weights Physical Therapy  Cardiac/Cardiomegaly: Aspirin 81mg  PO Atorvastatin Warfarin 10 mg PO, daily  HTN: 180/67 on admission, continue to monitor Carvedilol 12.5 PO daily Irbesartan 300 mg, PO daily HCTZ 25 mg po  PVD: Warfarin Check INR  Pain:  Tylenol 650mg  PO PRN  FEN/GI: Heart Health Diet  *[facility]   This is a Psychologist, occupationalMedical Student Note.  The care of the patient was discussed with Dr. Garald BraverKennerly and the assessment and plan was formulated with their assistance.  Please see their note for official documentation of the patient encounter.   Signed: Lillia CarmelNida Erianna Jolly, Med Student 06/16/2014, 6:14 PM

## 2014-06-16 NOTE — ED Notes (Signed)
Pt to scanner with RN

## 2014-06-16 NOTE — ED Notes (Signed)
Code stroke called; 1212 patient arrival; 1146 last known well; 1210 stroke team arrived; 1212 neurologist arrived; 1209 phlebotomist arrived; 1220 code stroke canceled.

## 2014-06-16 NOTE — ED Provider Notes (Addendum)
I saw and evaluated the patient, reviewed the resident's note and I agree with the findings and plan.   EKG Interpretation   Date/Time:  Tuesday June 16 2014 15:38:39 EDT Ventricular Rate:  71 PR Interval:  197 QRS Duration: 93 QT Interval:  433 QTC Calculation: 471 R Axis:   -7 Text Interpretation:  Sinus rhythm Left ventricular hypertrophy No old  tracing to compare Confirmed by [facility]  [facility], TREY ([id number]) on 06/17/2014  7:21:07 AM      79 yo female presenting via EMS after a fall, subsequently developing confusion.  Code Stroke called PTA.  However, pt's mechanism was reported to be a mechanical fall, initially normal mental status, then slowly becoming confused.  I cancelled Code Stroke.  Trauma evaluation initiated.  On exam, well appearing, nontoxic, not distressed, normal respiratory effort, normal perfusion, GCS 14 (confused verbal responses), hematoma to posterior scalp, mild TTP of right lateral superior chest wall, abdomen soft and nontender.  Trauma eval in process.  CT head/C spine negative.  Pt appears to have post concussive symptoms.  Plan admit for obs.    Clinical Impression: 1. Fall, initial encounter   2. Disorientation        Merrie RoofJohn David Murray Guzzetta III, MD 06/17/14 0720  Merrie RoofJohn David Aleesia Henney III, MD 06/17/14 [phone]

## 2014-06-16 NOTE — ED Notes (Signed)
CBG 96. 

## 2014-06-16 NOTE — ED Notes (Signed)
Code stroke called off.

## 2014-06-16 NOTE — Progress Notes (Signed)
ANTICOAGULATION CONSULT NOTE - Initial Consult  Pharmacy Consult for coumadin Indication: continuing coumadin from PTA, no stated indication  Allergies  Allergen Reactions  . Contrast Media [Iodinated Diagnostic Agents] Itching, Rash and Other (See Comments)    Skin was peeling   . Latex Itching and Rash    Patient Measurements: Height:  (142.2 cm) Weight: 152 lb 3.2 oz (69.037 kg) IBW/kg (Calculated) : 36.3   Vital Signs: Temp: 98.2 F (36.8 C) (08/04 1705) Temp src: Oral (08/04 1705) BP: 180/67 mmHg (08/04 1705) Pulse Rate: 69 (08/04 1705)  Labs:  Recent Labs  06/16/14 1215  HGB 11.8*  HCT 35.5*  PLT 182  APTT 32  LABPROT 18.1*  INR 1.50*  CREATININE 0.88    Estimated Creatinine Clearance: 41.8 ml/min (by C-G formula based on Cr of 0.88).   Medical History: Past Medical History  Diagnosis Date  . Coronary artery disease   . Hypertension   . Carotid stenosis     "left side 80% blocked"  . High cholesterol   . Heart murmur   . GERD (gastroesophageal reflux disease)   . Anemia   . History of blood transfusion     "related to RLE OR"   . Arthritis     "right knee, ankles; lower back" (06/16/2014)  . Urinary incontinence     Medications:  Prescriptions prior to admission  Medication Sig Dispense Refill  . alendronate (FOSAMAX) 70 MG tablet Take 70 mg by mouth once a week.      Marland Kitchen aspirin EC 81 MG tablet Take 81 mg by mouth daily.      . calcium carbonate (OS-CAL) 600 MG TABS tablet Take 600 mg by mouth daily.      . carvedilol (COREG) 12.5 MG tablet Take 12.5 mg by mouth daily.      . celecoxib (CELEBREX) 200 MG capsule Take 200 mg by mouth daily.      . colesevelam (WELCHOL) 625 MG tablet Take 1,250 mg by mouth daily.      . CRESTOR 40 MG tablet Take 40 mg by mouth 2 (two) times daily.      Marland Kitchen esomeprazole (NEXIUM) 40 MG capsule Take 40 mg by mouth daily at 12 noon.      . ezetimibe (ZETIA) 10 MG tablet Take 10 mg by mouth daily.      . ferrous  sulfate 325 (65 FE) MG tablet Take 325 mg by mouth 2 (two) times a week.      . hydrochlorothiazide (HYDRODIURIL) 25 MG tablet Take 25 mg by mouth daily.      . Multiple Vitamin (VITAMIN LIQUID PO) Take 5 mLs by mouth daily.      Marland Kitchen olmesartan (BENICAR) 40 MG tablet Take 40 mg by mouth daily.      Marland Kitchen omega-3 acid ethyl esters (LOVAZA) 1 G capsule Take 2 g by mouth 2 (two) times daily.      . potassium chloride (K-DUR,KLOR-CON) 10 MEQ tablet Take 10 mEq by mouth 2 (two) times daily.      . vitamin B-12 (CYANOCOBALAMIN) 500 MCG tablet Take 500 mcg by mouth daily.      . Vitamin D, Ergocalciferol, (DRISDOL) 50000 UNITS CAPS capsule Take 50,000 Units by mouth every 7 (seven) days.      Marland Kitchen warfarin (COUMADIN) 5 MG tablet Take 5-7.5 mg by mouth daily. Take  by mouth on Monday and Friday. Take 7.5mg  by mouth on Sunday, Tuesday, Wednesday, Thursday and Saturday.  Assessment: 79 yo F s/p running into a glass door, fell backwards onto head.  Head CT negative for bleed.  Pharmacy consulted to continue home coumadin.  Unclear why she is taking coumadin (most likely afib but no indication with coumadin protocol). Home dose 7.5 mg daily except 5 mg on M,F, last dose 06/15/14. Admission INR is subtherapeutic at 1.5.  Hg 11.8, pltc 182.  Goal of Therapy:  INR 2-3   Plan: Coumadin 10 mg po x 1 dose tonight Daily INR  Herby Abraham, Pharm.D. [phone] 06/16/2014 6:33 PM

## 2014-06-16 NOTE — ED Notes (Signed)
Cancelled Code Stroke per Dr. Loretha StaplerWofford

## 2014-06-16 NOTE — H&P (Signed)
Date: 06/16/2014               Patient Name:  Ana Chapman MRN: [id number]  DOB: [birth year] Age / Sex: 79 y.o., female   PCP: Darryl Lent, PA-C         Medical Service: Internal Medicine Teaching Service         Attending Physician: Dr. Judyann Munson, MD    First Contact: Lillia Carmel, MS4 Pager: [phone]  Second Contact: Dr. Garald Braver Pager: [phone]       After Hours (After 5p/  First Contact Pager: [phone]  weekends / holidays): Second Contact Pager: [phone]   Chief Complaint: Fall  History of Present Illness:  Ana Chapman is a 79 year old woman with PMH significant for cardiomegaly, 80% left carotid artery stenosis, and PVD s/p stent placement, Chapman warfarin, who presented to the ED after a fall. She reports losing consciousness before the episode and does not recall the events preceding her fall. Per EMT notes, she was seen falling into the bus door and then backwards hitting her head Chapman the concrete. She remembers being assisted by medics into the ambulance but was confused. She reports skipping breakfast this morning as she does most morning for lack of time. She had spend at least two hours Chapman the bus ride and getting to the social security office before this event and had been in her usual state of health. She denies loss of bowel/bladder control, tongue biting, history of seizures, or convulsions. She denies dizziness, lightheadedness, tunnel vision, and nausea prior to the event. She does report recent palpitations, shortness of breath, and chest tightening/pain, which is usually relieved with rest after 2-3 hours. She is independent of her ADLs and lives alone.  In the ED, CT head w/o contrast was negative for intracranial bleed, CT cervical spine was negative for fracture but she remained in a C collar as she continued to complain of neck pain.     Medications Prior to Admission  Medication Sig Dispense Refill  . alendronate (FOSAMAX) 70 MG tablet Take 70 mg by mouth once a week.       Marland Kitchen aspirin EC 81 MG tablet Take 81 mg by mouth daily.      . calcium carbonate (OS-CAL) 600 MG TABS tablet Take 600 mg by mouth daily.      . carvedilol (COREG) 12.5 MG tablet Take 12.5 mg by mouth daily.      . celecoxib (CELEBREX) 200 MG capsule Take 200 mg by mouth daily.      . colesevelam (WELCHOL) 625 MG tablet Take 1,250 mg by mouth daily.      . CRESTOR 40 MG tablet Take 40 mg by mouth 2 (two) times daily.      Marland Kitchen esomeprazole (NEXIUM) 40 MG capsule Take 40 mg by mouth daily at 12 noon.      . ezetimibe (ZETIA) 10 MG tablet Take 10 mg by mouth daily.      . ferrous sulfate 325 (65 FE) MG tablet Take 325 mg by mouth 2 (two) times a week.      . hydrochlorothiazide (HYDRODIURIL) 25 MG tablet Take 25 mg by mouth daily.      . Multiple Vitamin (VITAMIN LIQUID PO) Take 5 mLs by mouth daily.      Marland Kitchen olmesartan (BENICAR) 40 MG tablet Take 40 mg by mouth daily.      Marland Kitchen omega-3 acid ethyl esters (LOVAZA) 1 G capsule Take 2 g by mouth 2 (two)  times daily.      . potassium chloride (K-DUR,KLOR-CON) 10 MEQ tablet Take 10 mEq by mouth 2 (two) times daily.      . vitamin B-12 (CYANOCOBALAMIN) 500 MCG tablet Take 500 mcg by mouth daily.      . Vitamin D, Ergocalciferol, (DRISDOL) 50000 UNITS CAPS capsule Take 50,000 Units by mouth every 7 (seven) days.      Marland Kitchen warfarin (COUMADIN) 5 MG tablet Take 5-7.5 mg by mouth daily. Take 5mg  by mouth Chapman Monday and Friday. Take 7.5mg  by mouth Chapman Sunday, Tuesday, Wednesday, Thursday and Saturday.        Meds: Current Facility-Administered Medications  Medication Dose Route Frequency Provider Last Rate Last Dose  . 0.9 %  sodium chloride infusion  250 mL Intravenous PRN Ky Barban, MD      . acetaminophen (TYLENOL) tablet 650 mg  650 mg Oral Q6H PRN Ky Barban, MD       Or  . acetaminophen (TYLENOL) suppository 650 mg  650 mg Rectal Q6H PRN Ky Barban, MD      . albuterol (PROVENTIL) (2.5 MG/3ML) 0.083% nebulizer solution 2.5 mg  2.5  mg Nebulization Q2H PRN Ky Barban, MD      . aspirin EC tablet 81 mg  81 mg Oral Daily Ky Barban, MD      . Ana Chapman 06/17/2014] atorvastatin (LIPITOR) tablet 80 mg  80 mg Oral q1800 Ky Barban, MD      . Ana Chapman 06/17/2014] calcium carbonate (OS-CAL - dosed in mg of elemental calcium) tablet 500 mg of elemental calcium  1 tablet Oral Q breakfast Judyann Munson, MD      . carvedilol (COREG) tablet 12.5 mg  12.5 mg Oral Daily Ky Barban, MD      . colesevelam [facility]) tablet 1,250 mg  1,250 mg Oral Daily Ky Barban, MD      . cyanocobalamin tablet 500 mcg  500 mcg Oral Daily Judyann Munson, MD      . ezetimibe (ZETIA) tablet 10 mg  10 mg Oral Daily Ky Barban, MD      . Ana Chapman 06/18/2014] ferrous sulfate tablet 325 mg  325 mg Oral Once per day Chapman Mon Thu Ana Almas D Ojani Berenson, MD      . hydrochlorothiazide (HYDRODIURIL) tablet 25 mg  25 mg Oral Daily Ky Barban, MD      . irbesartan (AVAPRO) tablet 300 mg  300 mg Oral Daily Ky Barban, MD      . omega-3 acid ethyl esters (LOVAZA) capsule 2 g  2 g Oral BID Ky Barban, MD      . pantoprazole (PROTONIX) EC tablet 40 mg  40 mg Oral Daily Ky Barban, MD      . sodium chloride 0.9 % injection 3 mL  3 mL Intravenous Q12H Ky Barban, MD      . sodium chloride 0.9 % injection 3 mL  3 mL Intravenous Q12H Ky Barban, MD      . sodium chloride 0.9 % injection 3 mL  3 mL Intravenous PRN Ky Barban, MD      . warfarin (COUMADIN) tablet 10 mg  10 mg Oral Once Ana Chapman, RPH      . [START Chapman 06/17/2014] Warfarin - Pharmacist Dosing Inpatient   Does not apply q1800 Ana Chapman, [facility]        Allergies: Allergies as of 06/16/2014 - Review  Complete 06/16/2014  Allergen Reaction Noted  . Contrast media [iodinated diagnostic agents] Itching, Rash, and Other (See Comments) 06/16/2014  . Latex Itching and Rash 06/16/2014   Past Medical History   Diagnosis Date  . Coronary artery disease   . Hypertension   . Carotid stenosis     "left side 80% blocked"  . High cholesterol   . Heart murmur   . GERD (gastroesophageal reflux disease)   . Anemia   . History of blood transfusion     "related to RLE OR"   . Arthritis     "right knee, ankles; lower back" (06/16/2014)  . Urinary incontinence    Past Surgical History  Procedure Laterality Date  . Peripheral arterial stent graft Bilateral     "RLE was 100% blocked"  . Tubal ligation  ~ 1962   History reviewed. No pertinent family history. History   Social History  . Marital Status: Single    Spouse Name: N/A    Number of Children: N/A  . Years of Education: N/A   Occupational History  . Not Chapman file.   Social History Main Topics  . Smoking status: Never Smoker   . Smokeless tobacco: Never Used  . Alcohol Use: No  . Drug Use: No  . Sexual Activity: No   Other Topics Concern  . Not Chapman file   Social History Narrative  . No narrative Chapman file    Review of Systems: Review of Systems  Constitutional: Negative for fever, chills, weight loss, malaise/fatigue and diaphoresis.  Eyes: Negative for blurred vision.  Respiratory: Negative for cough and shortness of breath.   Cardiovascular: Negative for chest pain and palpitations.  Gastrointestinal: Negative for nausea and vomiting.  Genitourinary: Negative for dysuria, frequency and hematuria.       Has chronic urinary incontinence   Musculoskeletal: Positive for neck pain.  Skin: Negative for rash.  Neurological: Positive for loss of consciousness. Negative for dizziness, tremors, weakness and headaches.  Psychiatric/Behavioral: Negative for depression.    Physical Exam: Blood pressure 180/67, pulse 69, temperature 98.2 F (36.8 C), temperature source Oral, resp. rate 20, height 4\' 8"  (1.422 m), weight 152 lb 3.2 oz (69.037 kg), SpO2 97.00%. Physical Exam  Nursing note and vitals reviewed. Constitutional: She is  oriented to person, place, and time. She appears well-developed and well-nourished.  HENT:  Head: Normocephalic.  C-collar in place   Cardiovascular: Normal rate and regular rhythm.   Murmur heard. SEM heart best at the RLSB PD pulses present but diminished bilaterally   Respiratory: Effort normal and breath sounds normal. No respiratory distress. She has no wheezes. She has no rales.  GI: Soft. Bowel sounds are normal. She exhibits no distension. There is no tenderness. There is no rebound and no guarding.  Musculoskeletal: She exhibits no edema and no tenderness.  Neurological: She is alert and oriented to person, place, and time.  Skin: Skin is warm and dry. No erythema.  Psychiatric: She has a normal mood and affect. Her behavior is normal.     Lab results: Basic Metabolic Panel:  Recent Labs  16/10/96 1215  NA 140  K 3.8  CL 102  CO2 24  GLUCOSE 103*  BUN 20  CREATININE 0.88  CALCIUM 9.2   Liver Function Tests:  Recent Labs  06/16/14 1215  AST 28  ALT 21  ALKPHOS 143*  BILITOT 0.4  PROT 6.8  ALBUMIN 3.8   CBC:  Recent Labs  06/16/14 1215  WBC 5.9  NEUTROABS 3.0  HGB 11.8*  HCT 35.5*  MCV 87.9  PLT 182   CBG:  Recent Labs  06/16/14 1244  GLUCAP 96   Coagulation:  Recent Labs  06/16/14 1215  LABPROT 18.1*  INR 1.50*    Imaging results:  Ct Head Wo Contrast  06/16/2014   CLINICAL DATA:  Post fall, Chapman Coumadin, now with confusion  EXAM: CT HEAD WITHOUT CONTRAST  CT CERVICAL SPINE WITHOUT CONTRAST  TECHNIQUE: Multidetector CT imaging of the head and cervical spine was performed following the standard protocol without intravenous contrast. Multiplanar CT image reconstructions of the cervical spine were also generated.  COMPARISON:  Head CT - 07/10/2008  FINDINGS: CT HEAD FINDINGS  Advanced atrophy with diffuse sulcal prominence and centralized volume loss with mild commensurate ex vacuo dilatation of the ventricular system, likely progressed  in the interval. Scattered minimal periventricular hypodensities compatible with microvascular ischemic disease. Bilateral basal ganglial calcifications. The gray-white differentiation is otherwise well maintained without CT evidence of acute large territory infarct. No intraparenchymal or extra-axial mass or hemorrhage. Normal configuration of the ventricles and basilar cisterns. No midline shift. There is under pneumatization of the bilateral frontal sinuses. The remaining paranasal sinuses and mastoid air cells are normally aerated. No air-fluid levels. Regional soft tissues appear normal. No displaced calvarial fracture.  CT CERVICAL SPINE FINDINGS  C1 to the superior endplate of T3 is imaged.  There is mild (approximately 2 mm) of anterolisthesis of C5 upon C6. The bilateral facets are normally aligned. The dens is normally positioned between the lateral masses of C1. Moderate degenerative change of the atlanto dental articulation. A tiny accessory ossicle is noted about the caudal aspect of the anterior arch of C1. Normal atlanto-axial articulations. Incidental note is made of partial ossification of the anterior longitudinal ligament anterior to the C2-C3 intervertebral disc space.  No fracture or static subluxation of the cervical spine. Cervical vertebral body heights are preserved. Prevertebral soft tissues are normal.  There is mild to moderate multilevel DDD throughout the cervical spine, likely worse at C4-C5 and C6-C7 with disc space height loss, endplate irregularity and sclerosis.  There is diffuse enlargement of the thyroid gland with a suspected approximately 2.7 x 1.6 cm partially calcified left-sided thyroid nodule (image 54, series 301 as well as an ill-defined approximately 1.9 x 1.8 cm hypo attenuating nodule within the thyroid isthmus (image 61, series 302) and an ill-defined approximately 3.0 x 2.4 cm nodule within the right lobe of the thyroid (image 63, series 302).  Atherosclerotic plaque  within the bilateral carotid bulbs, left greater than right. No bulky cervical adenopathy Chapman this noncontrast examination. Limited visualization of lung apices is normal.  IMPRESSION: 1. Advanced atrophy and mild microvascular ischemic disease without acute intracranial process. 2. No fracture or static subluxation of the cervical spine. 3. Mild to moderate multilevel DDD throughout the cervical spine, likely worse at C4-C5 and C6-C7. 4. Diffusely enlarged thyroid gland with multiple bilateral indeterminate thyroid nodules. Further evaluation with nonemergent thyroid ultrasound could be performed as clinically indicated.   Electronically Signed   By: Simonne Come M.D.   Chapman: 06/16/2014 13:34   Ct Cervical Spine Wo Contrast  06/16/2014   CLINICAL DATA:  Post fall, Chapman Coumadin, now with confusion  EXAM: CT HEAD WITHOUT CONTRAST  CT CERVICAL SPINE WITHOUT CONTRAST  TECHNIQUE: Multidetector CT imaging of the head and cervical spine was performed following the standard protocol without intravenous contrast. Multiplanar CT image reconstructions of the cervical spine were  also generated.  COMPARISON:  Head CT - 07/10/2008  FINDINGS: CT HEAD FINDINGS  Advanced atrophy with diffuse sulcal prominence and centralized volume loss with mild commensurate ex vacuo dilatation of the ventricular system, likely progressed in the interval. Scattered minimal periventricular hypodensities compatible with microvascular ischemic disease. Bilateral basal ganglial calcifications. The gray-white differentiation is otherwise well maintained without CT evidence of acute large territory infarct. No intraparenchymal or extra-axial mass or hemorrhage. Normal configuration of the ventricles and basilar cisterns. No midline shift. There is under pneumatization of the bilateral frontal sinuses. The remaining paranasal sinuses and mastoid air cells are normally aerated. No air-fluid levels. Regional soft tissues appear normal. No displaced calvarial  fracture.  CT CERVICAL SPINE FINDINGS  C1 to the superior endplate of T3 is imaged.  There is mild (approximately 2 mm) of anterolisthesis of C5 upon C6. The bilateral facets are normally aligned. The dens is normally positioned between the lateral masses of C1. Moderate degenerative change of the atlanto dental articulation. A tiny accessory ossicle is noted about the caudal aspect of the anterior arch of C1. Normal atlanto-axial articulations. Incidental note is made of partial ossification of the anterior longitudinal ligament anterior to the C2-C3 intervertebral disc space.  No fracture or static subluxation of the cervical spine. Cervical vertebral body heights are preserved. Prevertebral soft tissues are normal.  There is mild to moderate multilevel DDD throughout the cervical spine, likely worse at C4-C5 and C6-C7 with disc space height loss, endplate irregularity and sclerosis.  There is diffuse enlargement of the thyroid gland with a suspected approximately 2.7 x 1.6 cm partially calcified left-sided thyroid nodule (image 54, series 301 as well as an ill-defined approximately 1.9 x 1.8 cm hypo attenuating nodule within the thyroid isthmus (image 61, series 302) and an ill-defined approximately 3.0 x 2.4 cm nodule within the right lobe of the thyroid (image 63, series 302).  Atherosclerotic plaque within the bilateral carotid bulbs, left greater than right. No bulky cervical adenopathy Chapman this noncontrast examination. Limited visualization of lung apices is normal.  IMPRESSION: 1. Advanced atrophy and mild microvascular ischemic disease without acute intracranial process. 2. No fracture or static subluxation of the cervical spine. 3. Mild to moderate multilevel DDD throughout the cervical spine, likely worse at C4-C5 and C6-C7. 4. Diffusely enlarged thyroid gland with multiple bilateral indeterminate thyroid nodules. Further evaluation with nonemergent thyroid ultrasound could be performed as clinically  indicated.   Electronically Signed   By: Simonne Come M.D.   Chapman: 06/16/2014 13:34   Dg Chest Port 1 View  06/16/2014   CLINICAL DATA:  Recent traumatic injury with pain  EXAM: PORTABLE CHEST - 1 VIEW  COMPARISON:  09/17/2008  FINDINGS: The heart size and mediastinal contours are within normal limits. Both lungs are clear. The visualized skeletal structures are unremarkable. An old left clavicular fracture is noted.  IMPRESSION: No active disease.   Electronically Signed   By: Alcide Clever M.D.   Chapman: 06/16/2014 14:29    Other results: ED ECG REPORT   Date: 06/16/2014  EKG Time: 15:38  Rate: 71  Rhythm: normal sinus rhythm,    Axis: normal  Intervals:normal  ST&T Change: TWI in lead III  Narrative Interpretation: no previous tracing to compare    Assessment & Plan by Problem: 79 yo female with cardiomegaly, 80% carotid stenosis, and PVD admitted after a syncopal event.  Syncope: Unclear etiology. Differential broad to include cardiac arrhythmia (she has cardiomegaly and hx of cardiac disease), medication  induced, hypoglycemic event (patient missed breakfast), worsening carotid artery disease (has 80% stenosis in left carotid artery), autonomic dysfunction. She has no Neurologic findings with CT head negative for intracranial process, seizure unlikely given no hx of seizure, no seizure-like activity and no loss of bowel/bladder control.  -Admit to telemetry -Orthostatic vitals -PT eval -troponin q6 h x3 -Fall precaution -Obtain records from her Cardiologist  Fall: C-spine CT negative for fracture. -Continue C-collar for comfort -Tylenol PRN for pain -PT  Thyroid goiter: CT cervical spine ordered today with diffusely enlarged thyroid gland with multiple bilateral  indeterminate thyroid nodules.  -Ordered TSH -Consider thyroid US  Cardiomegaly: Will continue her home ASA 81mg  daily, BB, and statin.  -[facility]  HTN: BP initially elevated to  180/67 but she does not recall taking her antihypertensives this morning.  -Continue home Carvedilol 12.5 PO daily  -Continue home Irbesartan 300 mg, PO dai -Continue HCTZ 25mg  daily   -PAD/PVD: s/p stent per her report. Chapman warfarin, INR sub therapeutic at 1.5 -Obtain records from her PCP -Continue warfarin per pharmacy  Chronic anemia- She reports chronic anemia. Hg of 11.8 which is almost wnl.  -Obtain records from her PCP for baseline Hg.   FEN:  NSL Heart Health Diet  DVT prophylaxis: SCDs, warfarin  Dispo: Disposition is deferred at this time, awaiting improvement of current medical problems. Anticipated discharge in approximately 1-2 day(s).   The patient does have a current PCP Darryl Lent(Amanda Taylor, PA-C) and [facility] after discharge.  The patient does not have transportation limitations that hinder transportation to clinic appointments.  Signed: Ky BarbanSolianny D Norvell Ureste, MD 06/16/2014, 7:38 PM

## 2014-06-16 NOTE — H&P (Signed)
  I have seen and examined the patient myself, and I have reviewed the note by Lillia CarmelNida Waheed, MS 4 and was present during the interview and physical exam.  Please see my separate H&P for additional findings, assessment, and plan.   Signed: Ky BarbanSolianny D Aleshka Corney, MD 06/16/2014, 8:13 PM

## 2014-06-17 ENCOUNTER — Inpatient Hospital Stay (HOSPITAL_COMMUNITY): Payer: Medicare Other

## 2014-06-17 DIAGNOSIS — I10 Essential (primary) hypertension: Secondary | ICD-10-CM

## 2014-06-17 DIAGNOSIS — I517 Cardiomegaly: Secondary | ICD-10-CM

## 2014-06-17 DIAGNOSIS — R55 Syncope and collapse: Principal | ICD-10-CM

## 2014-06-17 LAB — COMPREHENSIVE METABOLIC PANEL
ALK PHOS: 120 U/L — AB (ref 39–117)
ALT: 14 U/L (ref 0–35)
AST: 20 U/L (ref 0–37)
Albumin: 3.3 g/dL — ABNORMAL LOW (ref 3.5–5.2)
Anion gap: 14 (ref 5–15)
BUN: 18 mg/dL (ref 6–23)
CO2: 24 meq/L (ref 19–32)
Calcium: 9 mg/dL (ref 8.4–10.5)
Chloride: 104 mEq/L (ref 96–112)
Creatinine, Ser: 0.88 mg/dL (ref 0.50–1.10)
GFR, EST AFRICAN AMERICAN: 72 mL/min — AB (ref >90)
GFR, EST NON AFRICAN AMERICAN: 62 mL/min — AB (ref >90)
GLUCOSE: 100 mg/dL — AB (ref 70–99)
POTASSIUM: 3.7 meq/L (ref 3.7–5.3)
SODIUM: 142 meq/L (ref 137–147)
Total Bilirubin: 0.5 mg/dL (ref 0.3–1.2)
Total Protein: 6.1 g/dL (ref 6.0–8.3)

## 2014-06-17 LAB — PROTIME-INR
INR: 1.3 (ref 0.00–1.49)
Prothrombin Time: 16.2 seconds — ABNORMAL HIGH (ref 11.6–15.2)

## 2014-06-17 LAB — TROPONIN I: Troponin I: <0.3 ng/mL (ref <0.30)

## 2014-06-17 MED ORDER — CARVEDILOL 3.125 MG PO TABS
3.1250 mg | ORAL_TABLET | Freq: Two times a day (BID) | ORAL | Status: DC
  Administered 2014-06-17 – 2014-06-20 (×7): 3.125 mg via ORAL
  Filled 2014-06-17 (×8): qty 1

## 2014-06-17 MED ORDER — REGADENOSON 0.4 MG/5ML IV SOLN
0.4000 mg | Freq: Once | INTRAVENOUS | Status: AC
  Administered 2014-06-18: 0.4 mg via INTRAVENOUS
  Filled 2014-06-17: qty 5

## 2014-06-17 MED ORDER — WARFARIN SODIUM 7.5 MG PO TABS
7.5000 mg | ORAL_TABLET | Freq: Once | ORAL | Status: AC
  Administered 2014-06-17: 7.5 mg via ORAL
  Filled 2014-06-17: qty 1

## 2014-06-17 NOTE — Progress Notes (Addendum)
I have seen the patient and reviewed the daily progress note by Lillia CarmelNida Waheed MS 4 and discussed the care of the patient with them.  See below for documentation of my findings, assessment, and plans.  Subjective: No overnight events. She worked with PT today and felt no dizziness, very steady on her feet. Her neck is still tender and she remains with imobilizing C-collar in place. No chest pain, SOB, or palpitations.   Objective: Vital signs in last 24 hours: Filed Vitals:   06/16/14 2005 06/17/14 0517 06/17/14 1050 06/17/14 1320  BP: 176/82 168/78 142/70 164/70  Pulse: 82 74 67 65  Temp:  98.4 F (36.9 C) 98.5 F (36.9 C) 98.2 F (36.8 C)  TempSrc:  Oral Oral Oral  Resp:   18 18  Height:      Weight:  152 lb 3.2 oz (69.037 kg)    SpO2: 98% 95% 96% 98%   Weight change:   Intake/Output Summary (Last 24 hours) at 06/17/14 1556 Last data filed at 06/17/14 1300  Gross per 24 hour  Intake    422 ml  Output      0 ml  Net    422 ml   Vitals reviewed. General: resting in bed, in NAD HEENT: PERRL, EOMI, no scleral icterus, dry MM. Edentulous, mild dysarthria.  Immobilizing C-collar in place. Thyroid gland not examined 22/ to C-collar.  Cardiac: RRR, no rubs, murmurs or gallops. PD pulsed diminished bilaterally.  Pulm: clear to auscultation bilaterally, no wheezes, rales, or rhonchi Abd: soft, nontender, nondistended, BS present Ext: warm and well perfused, no pedal edema Neuro: alert and oriented X3, cranial nerves II-XII grossly intact, strength and sensation to light touch equal in bilateral upper and lower extremities  Lab Results: Reviewed and documented in Electronic Record Micro Results: Reviewed and documented in Electronic Record Studies/Results: Reviewed and documented in Electronic Record Medications: I have reviewed the patient's current medications. Scheduled Meds: . aspirin EC  81 mg Oral Daily  . atorvastatin  80 mg Oral q1800  . calcium carbonate  1 tablet  Oral Q breakfast  . carvedilol  3.125 mg Oral BID WC  . colesevelam  1,250 mg Oral Daily  . vitamin B-12  500 mcg Oral Daily  . ezetimibe  10 mg Oral Daily  . [START ON 06/18/2014] ferrous sulfate  325 mg Oral Once per day on Mon Thu  . hydrochlorothiazide  25 mg Oral Daily  . irbesartan  300 mg Oral Daily  . omega-3 acid ethyl esters  2 g Oral BID  . pantoprazole  40 mg Oral Daily  . [START ON 06/18/2014] regadenoson  0.4 mg Intravenous Once  . sodium chloride  3 mL Intravenous Q12H  . sodium chloride  3 mL Intravenous Q12H  . warfarin  7.5 mg Oral ONCE-1800  . Warfarin - Pharmacist Dosing Inpatient   Does not apply q1800   Continuous Infusions:  PRN Meds:.sodium chloride, acetaminophen, acetaminophen, albuterol, sodium chloride Assessment/Plan: 79 yo female with cardiomegaly, 80% carotid stenosis, and PVD admitted after a syncopal event.   Syncope: Etiology unclear. Likely cardiac due to PMH of cardiac issues. Differential includes cardiac arrhythmia, medication induced syncope, hypoglycemic event (2/2 patient missing breakfast), worsening carotid artery disease (80% stenosis in left carotid artery), or autonomic dysfunction. She has no neurologic deficits on physical exam and CT head negative for intracranial process. Seizure unlikely given no hx of seizure, no seizure-like activity, and no loss of bowel/bladder control. Patient is orthostatic (189/64 to  105/50 standing). Will hold off on changing HTN meds 2/2 cardiac workup. Troponin x3 negative, EKG with no changes.  -Continue telemetry -Cardiology following, appreciate recommendations: nuclear stress test tomorrow (hold Cored dose tonight for this test), carotid dopplers, 2D echo -Continue PT  Cervical spine pain:  She continues to be in immobilizing C-collar. CT cervical spine negative for fractures or subluxation. Neurosurgery called but will not see patient at this time as no need for formal consulted but recommend Flexion/extension  cervical spine x-ray and soft neck collar if imaging is normal/benign.  -f/u on c-spine flexion/extension Xray.  -Tylenol PRN for pain   Cardiomegaly: Unable to obtain records from her Cardiologist in [facility] but pt was last seen in 2010.  -Continue home ASA 81mg , statin -Cardiology following  HTN: 180/67 on admission, 164/70 currently. Patient is orthostatic. Continue to monitor.  -Hold Coreg until after stress test tomorrow -Continue Irbesartan 300mg  PO daily, HCTZ 25mg  daily  PVD: She reports having stents. She is on [facility] with Warfarin. INR 1.5 on presentation, subtherapeutic  -Warfarin per pharmacy   FEN/GI:  NPO after midnight  Protonix 40mg  daily   Dispo: Disposition is deferred at this time, awaiting improvement of current medical problems.  Anticipated discharge in approximately 1-2 day(s).   The patient does have a current PCP Darryl Lent, PA-C) and does not need an [facility] hospital follow-up appointment after discharge.  The patient does not have transportation limitations that hinder transportation to clinic appointments.  .Services Needed at time of discharge: Y = Yes, Blank = No PT:   OT:   RN:   Equipment:   Other:     LOS: 1 day   Ky Barban, MD 06/17/2014, 3:56 PM
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
## 2014-06-17 NOTE — Progress Notes (Signed)
  Date: 06/17/2014  Patient name: Ana Chapman  Medical record number: [id number]  Date of birth: 02/12/1935   This patient has been seen and the plan of care was discussed with the house staff. Please see their note for complete details. I concur with their findings with the following additions/corrections:  We will continue to work up etiology of her syncopal episode that we suspect is cardiac in nature. Attempt to reach out to her cardiologist for records and likely consult cardiology to see if further work up other than echo, carotid dopplers should be done.  Ana Munsonynthia Annaleese Guier, MD 06/17/2014, 10:19 PM

## 2014-06-17 NOTE — Progress Notes (Signed)
ANTICOAGULATION CONSULT NOTE - Follow Up Consult  Pharmacy Consult for coumadin Indication: hx PVD  Allergies  Allergen Reactions  . Contrast Media [Iodinated Diagnostic Agents] Itching, Rash and Other (See Comments)    Skin was peeling   . Latex Itching and Rash    Patient Measurements: Height: 4\' 8"  (142.2 cm) Weight: 152 lb 3.2 oz (69.037 kg) IBW/kg (Calculated) : 36.3   Vital Signs: Temp: 98.5 F (36.9 C) (08/05 1050) Temp src: Oral (08/05 1050) BP: 142/70 mmHg (08/05 1050) Pulse Rate: 67 (08/05 1050)  Labs:  Recent Labs  06/16/14 1215 06/16/14 1815 06/16/14 2356 06/17/14 0940  HGB 11.8*  --   --   --   HCT 35.5*  --   --   --   PLT 182  --   --   --   APTT 32  --   --   --   LABPROT 18.1*  --   --  16.2*  INR 1.50*  --   --  1.30  CREATININE 0.88  --  0.88  --   TROPONINI  --  <0.30 <0.30  --     Estimated Creatinine Clearance: 41.8 ml/min (by C-G formula based on Cr of 0.88).  Assessment: Patient is a 79 y.o on coumadin since 2010 for PVD (per [facility]).  Home coumadin dose is 7.5mg  daily except 5mg  on MWF.  INR is sub-therapeutic at 1.30.  Goal of Therapy:  INR 2-3 Monitor platelets by anticoagulation protocol: Yes   Plan:  1) coumadin 7.5mg  PO x1 today (1.5x home dose)  Ana Chapman P 06/17/2014,11:00 AM

## 2014-06-17 NOTE — ED Provider Notes (Signed)
I saw and evaluated the patient, reviewed the resident's note and I agree with the findings and plan.   EKG Interpretation   Date/Time:  Tuesday June 16 2014 15:38:39 EDT Ventricular Rate:  71 PR Interval:  197 QRS Duration: 93 QT Interval:  433 QTC Calculation: 471 R Axis:   -7 Text Interpretation:  Sinus rhythm Left ventricular hypertrophy No old  tracing to compare Confirmed by [facility]  MD, TREY ([id number]) on 06/17/2014  7:21:07 AM        Candyce ChurnJohn David Marysa Wessner III, MD 06/17/14 [phone]
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
## 2014-06-17 NOTE — Progress Notes (Signed)
Subjective: [name] continues to be in an immobile c-spine. Expresses appreciation to not have any fractures from syncopal event. No complaints and no acute events overnight.   Objective: Vital signs in last 24 hours: Filed Vitals:   06/16/14 2005 06/17/14 0517 06/17/14 1050 06/17/14 1320  BP: 176/82 168/78 142/70 164/70  Pulse: 82 74 67 65  Temp:  98.4 F (36.9 C) 98.5 F (36.9 C) 98.2 F (36.8 C)  TempSrc:  Oral Oral Oral  Resp:   18 18  Height:      Weight:  69.037 kg (152 lb 3.2 oz)    SpO2: 98% 95% 96% 98%   Orthostatics: 189/64 to 105/50 standing Weight change:   Intake/Output Summary (Last 24 hours) at 06/17/14 1444 Last data filed at 06/17/14 0848  Gross per 24 hour  Intake    200 ml  Output      0 ml  Net    200 ml   Constitutional: Sitting with immobile c-spine on. In no acute distress.  HENT:  Head: Normocephalic, Dry MM Cardiovascular: Normal rate, regular rhythm and normal heart sounds.  Bilateral pulses diminished. Pulmonary/Chest: Effort normal. No respiratory distress. CTAB, no wheezes.  Abdominal: Soft. Bowel sounds are normal. No abd distention or tenderness. Neurological: She is alert and oriented to person, place, and time. Cranial nerves II-XII grossly intact.  Ext: No pitting edema.  Lab Results: Basic Metabolic Panel:  Recent Labs Lab 06/16/14 1215 06/16/14 2356  NA 140 142  K 3.8 3.7  CL 102 104  CO2 24 24  GLUCOSE 103* 100*  BUN 20 18  CREATININE 0.88 0.88  CALCIUM 9.2 9.0   Liver Function Tests:  Recent Labs Lab 06/16/14 1215 06/16/14 2356  AST 28 20  ALT 21 14  ALKPHOS 143* 120*  BILITOT 0.4 0.5  PROT 6.8 6.1  ALBUMIN 3.8 3.3*   No results found for this basename: LIPASE, AMYLASE,  in the last 168 hours No results found for this basename: AMMONIA,  in the last 168 hours CBC:  Recent Labs Lab 06/16/14 1215  WBC 5.9  NEUTROABS 3.0  HGB 11.8*  HCT 35.5*  MCV 87.9  PLT 182   Cardiac Enzymes:  Recent  Labs Lab 06/16/14 1815 06/16/14 2356  TROPONINI <0.30 <0.30   BNP: No results found for this basename: PROBNP,  in the last 168 hours D-Dimer: No results found for this basename: DDIMER,  in the last 168 hours CBG:  Recent Labs Lab 06/16/14 1244  GLUCAP 96   Hemoglobin A1C: No results found for this basename: HGBA1C,  in the last 168 hours Fasting Lipid Panel: No results found for this basename: CHOL, HDL, LDLCALC, TRIG, CHOLHDL, LDLDIRECT,  in the last 168 hours Thyroid Function Tests:  Recent Labs Lab 06/16/14 1830  TSH 2.950   Coagulation:  Recent Labs Lab 06/16/14 1215 06/17/14 0940  LABPROT 18.1* 16.2*  INR 1.50* 1.30   Anemia Panel: No results found for this basename: VITAMINB12, FOLATE, FERRITIN, TIBC, IRON, RETICCTPCT,  in the last 168 hours Urine Drug Screen: Drugs of Abuse  No results found for this basename: labopia, cocainscrnur, labbenz, amphetmu, thcu, labbarb    Alcohol Level: No results found for this basename: ETH,  in the last 168 hours Urinalysis: No results found for this basename: COLORURINE, APPERANCEUR, LABSPEC, PHURINE, GLUCOSEU, HGBUR, BILIRUBINUR, KETONESUR, PROTEINUR, UROBILINOGEN, NITRITE, LEUKOCYTESUR,  in the last 168 hours Misc. Labs:   Micro Results: No results found for this or any previous visit (from  the past 240 hour(s)). Studies/Results: Ct Head Wo Contrast  06/16/2014   CLINICAL DATA:  Post fall, on Coumadin, now with confusion  EXAM: CT HEAD WITHOUT CONTRAST  CT CERVICAL SPINE WITHOUT CONTRAST  TECHNIQUE: Multidetector CT imaging of the head and cervical spine was performed following the standard protocol without intravenous contrast. Multiplanar CT image reconstructions of the cervical spine were also generated.  COMPARISON:  Head CT - 07/10/2008  FINDINGS: CT HEAD FINDINGS  Advanced atrophy with diffuse sulcal prominence and centralized volume loss with mild commensurate ex vacuo dilatation of the ventricular system, likely  progressed in the interval. Scattered minimal periventricular hypodensities compatible with microvascular ischemic disease. Bilateral basal ganglial calcifications. The gray-white differentiation is otherwise well maintained without CT evidence of acute large territory infarct. No intraparenchymal or extra-axial mass or hemorrhage. Normal configuration of the ventricles and basilar cisterns. No midline shift. There is under pneumatization of the bilateral frontal sinuses. The remaining paranasal sinuses and mastoid air cells are normally aerated. No air-fluid levels. Regional soft tissues appear normal. No displaced calvarial fracture.  CT CERVICAL SPINE FINDINGS  C1 to the superior endplate of T3 is imaged.  There is mild (approximately 2 mm) of anterolisthesis of C5 upon C6. The bilateral facets are normally aligned. The dens is normally positioned between the lateral masses of C1. Moderate degenerative change of the atlanto dental articulation. A tiny accessory ossicle is noted about the caudal aspect of the anterior arch of C1. Normal atlanto-axial articulations. Incidental note is made of partial ossification of the anterior longitudinal ligament anterior to the C2-C3 intervertebral disc space.  No fracture or static subluxation of the cervical spine. Cervical vertebral body heights are preserved. Prevertebral soft tissues are normal.  There is mild to moderate multilevel DDD throughout the cervical spine, likely worse at C4-C5 and C6-C7 with disc space height loss, endplate irregularity and sclerosis.  There is diffuse enlargement of the thyroid gland with a suspected approximately 2.7 x 1.6 cm partially calcified left-sided thyroid nodule (image 54, series 301 as well as an ill-defined approximately 1.9 x 1.8 cm hypo attenuating nodule within the thyroid isthmus (image 61, series 302) and an ill-defined approximately 3.0 x 2.4 cm nodule within the right lobe of the thyroid (image 63, series 302).   Atherosclerotic plaque within the bilateral carotid bulbs, left greater than right. No bulky cervical adenopathy on this noncontrast examination. Limited visualization of lung apices is normal.  IMPRESSION: 1. Advanced atrophy and mild microvascular ischemic disease without acute intracranial process. 2. No fracture or static subluxation of the cervical spine. 3. Mild to moderate multilevel DDD throughout the cervical spine, likely worse at C4-C5 and C6-C7. 4. Diffusely enlarged thyroid gland with multiple bilateral indeterminate thyroid nodules. Further evaluation with nonemergent thyroid ultrasound could be performed as clinically indicated.   Electronically Signed   By: Simonne Come M.D.   On: 06/16/2014 13:34   Ct Cervical Spine Wo Contrast  06/16/2014   CLINICAL DATA:  Post fall, on Coumadin, now with confusion  EXAM: CT HEAD WITHOUT CONTRAST  CT CERVICAL SPINE WITHOUT CONTRAST  TECHNIQUE: Multidetector CT imaging of the head and cervical spine was performed following the standard protocol without intravenous contrast. Multiplanar CT image reconstructions of the cervical spine were also generated.  COMPARISON:  Head CT - 07/10/2008  FINDINGS: CT HEAD FINDINGS  Advanced atrophy with diffuse sulcal prominence and centralized volume loss with mild commensurate ex vacuo dilatation of the ventricular system, likely progressed in the interval. Scattered minimal  periventricular hypodensities compatible with microvascular ischemic disease. Bilateral basal ganglial calcifications. The gray-white differentiation is otherwise well maintained without CT evidence of acute large territory infarct. No intraparenchymal or extra-axial mass or hemorrhage. Normal configuration of the ventricles and basilar cisterns. No midline shift. There is under pneumatization of the bilateral frontal sinuses. The remaining paranasal sinuses and mastoid air cells are normally aerated. No air-fluid levels. Regional soft tissues appear normal.  No displaced calvarial fracture.  CT CERVICAL SPINE FINDINGS  C1 to the superior endplate of T3 is imaged.  There is mild (approximately 2 mm) of anterolisthesis of C5 upon C6. The bilateral facets are normally aligned. The dens is normally positioned between the lateral masses of C1. Moderate degenerative change of the atlanto dental articulation. A tiny accessory ossicle is noted about the caudal aspect of the anterior arch of C1. Normal atlanto-axial articulations. Incidental note is made of partial ossification of the anterior longitudinal ligament anterior to the C2-C3 intervertebral disc space.  No fracture or static subluxation of the cervical spine. Cervical vertebral body heights are preserved. Prevertebral soft tissues are normal.  There is mild to moderate multilevel DDD throughout the cervical spine, likely worse at C4-C5 and C6-C7 with disc space height loss, endplate irregularity and sclerosis.  There is diffuse enlargement of the thyroid gland with a suspected approximately 2.7 x 1.6 cm partially calcified left-sided thyroid nodule (image 54, series 301 as well as an ill-defined approximately 1.9 x 1.8 cm hypo attenuating nodule within the thyroid isthmus (image 61, series 302) and an ill-defined approximately 3.0 x 2.4 cm nodule within the right lobe of the thyroid (image 63, series 302).  Atherosclerotic plaque within the bilateral carotid bulbs, left greater than right. No bulky cervical adenopathy on this noncontrast examination. Limited visualization of lung apices is normal.  IMPRESSION: 1. Advanced atrophy and mild microvascular ischemic disease without acute intracranial process. 2. No fracture or static subluxation of the cervical spine. 3. Mild to moderate multilevel DDD throughout the cervical spine, likely worse at C4-C5 and C6-C7. 4. Diffusely enlarged thyroid gland with multiple bilateral indeterminate thyroid nodules. Further evaluation with nonemergent thyroid ultrasound could be  performed as clinically indicated.   Electronically Signed   By: Simonne Come M.D.   On: 06/16/2014 13:34   Dg Chest Port 1 View  06/16/2014   CLINICAL DATA:  Recent traumatic injury with pain  EXAM: PORTABLE CHEST - 1 VIEW  COMPARISON:  09/17/2008  FINDINGS: The heart size and mediastinal contours are within normal limits. Both lungs are clear. The visualized skeletal structures are unremarkable. An old left clavicular fracture is noted.  IMPRESSION: No active disease.   Electronically Signed   By: Alcide Clever M.D.   On: 06/16/2014 14:29   Medications: I have reviewed the patient's current medications. Scheduled Meds: . aspirin EC  81 mg Oral Daily  . atorvastatin  80 mg Oral q1800  . calcium carbonate  1 tablet Oral Q breakfast  . colesevelam  1,250 mg Oral Daily  . vitamin B-12  500 mcg Oral Daily  . ezetimibe  10 mg Oral Daily  . [START ON 06/18/2014] ferrous sulfate  325 mg Oral Once per day on Mon Thu  . hydrochlorothiazide  25 mg Oral Daily  . irbesartan  300 mg Oral Daily  . omega-3 acid ethyl esters  2 g Oral BID  . pantoprazole  40 mg Oral Daily  . [START ON 06/18/2014] regadenoson  0.4 mg Intravenous Once  . sodium chloride  3 mL Intravenous Q12H  . sodium chloride  3 mL Intravenous Q12H  . warfarin  7.5 mg Oral ONCE-1800  . Warfarin - Pharmacist Dosing Inpatient   Does not apply q1800   Continuous Infusions:  PRN Meds:.sodium chloride, acetaminophen, acetaminophen, albuterol, sodium chloride Assessment/Plan: Principal Problem:   Syncope Active Problems:   CAD (coronary artery disease)   PAD (peripheral artery disease)   HTN (hypertension)   Carotid artery disease   Other and unspecified hyperlipidemia   GERD (gastroesophageal reflux disease)   Anemia   Enlarged thyroid gland  Ana Chapman is a 79 yo female with cardiomegaly, 80% carotid stenosis, and PVD admitted after a syncopal event.   Syncope: Etiology unclear. Likely cardiac due to PMH of cardiac issues.  Differential includes cardiac arrhythmia, medication induced syncope, hypoglycemic event (2/2 patient missing breakfast), worsening carotid artery disease (80% stenosis in left carotid artery), or autonomic dysfunction. She has no neurologic findings with CT head negative for intracranial process, seizure unlikely given no hx of seizure, no seizure-like activity, and no loss of bowel/bladder control. Patient is orthostatic (189/64 to 105/50 standing). Will hold off on changing HTN meds 2/2 cardiac workup. CT head 06/16/14-no active intracranial process.  Scheduled for echo, nuclear stress test, and bilateral carotid dopplers tomorrow (8/6) Discontinued carvedilol, will resume after cardiac testing tomorrow Troponins negative x 2 Continue on Telemetry  Daily weights  Physical Therapy   Cervical Spine: continues to be in immobile c-spine, negative CT cervical for fractures.  Flexion/extension cervical spine x-ray/imaging recommended by NSG after curbside call. Currently pending, if normal, will order soft collar.  Cardiomegaly:  Aspirin 81mg  PO  Atorvastatin  Warfarin 10 mg PO, daily   HTN: 180/67 on admission, 164/70 currently. Patient is orthostatic. Continue to monitor. D/C Carvedilol until after cardiac testing tmrw Irbesartan 300 mg, PO daily  HCTZ 25 mg po   PVD: Warfarin 1.5, subtherapeutic Warfarin per pharmacy  Pain:  Tylenol 650mg  PO PRN   FEN/GI:  NPO after midnight   *Sees Darryl Lent, PA at [facility].    This is a Psychologist, occupational Note.  The care of the patient was discussed with Dr. Garald Braver and the assessment and plan formulated with their assistance.  Please see their attached note for official documentation of the daily encounter.   LOS: 1 day   Lillia Carmel, Med Student 06/17/2014, 2:44 PM

## 2014-06-17 NOTE — Consult Note (Signed)
Referring Physician: Dr. Dorris Fetch Waheed/Dr. Kennerly/Dr. Carlyle Basques  Ana Chapman is an 79 y.o. female.                       Chief Complaint: Syncope  HPI: 79 year old female with loss of consciousness with head injury yesterday. H/O 80 % left carotid stenosis. She denies loss of bowel/bladder control, tongue biting, history of seizures, or convulsions. She denies dizziness, lightheadedness, tunnel vision, and nausea prior to the event. She has palpitations, shortness of breath, and chest tightening/pain, which is usually relieved with rest after 2-3 hours.    Past Medical History  Diagnosis Date  . Coronary artery disease   . Hypertension   . Carotid stenosis     "left side 80% blocked"  . High cholesterol   . Heart murmur   . GERD (gastroesophageal reflux disease)   . Anemia   . History of blood transfusion     "related to RLE OR"   . Arthritis     "right knee, ankles; lower back" (06/16/2014)  . Urinary incontinence       Past Surgical History  Procedure Laterality Date  . Peripheral arterial stent graft Bilateral     "RLE was 100% blocked"  . Tubal ligation  ~ 1962    History reviewed. No pertinent family history. Social History:  reports that she has never smoked. She has never used smokeless tobacco. She reports that she does not drink alcohol or use illicit drugs.  Allergies:  Allergies  Allergen Reactions  . Contrast Media [Iodinated Diagnostic Agents] Itching, Rash and Other (See Comments)    Skin was peeling   . Latex Itching and Rash    Medications Prior to Admission  Medication Sig Dispense Refill  . alendronate (FOSAMAX) 70 MG tablet Take 70 mg by mouth once a week.      Marland Kitchen aspirin EC 81 MG tablet Take 81 mg by mouth daily.      . calcium carbonate (OS-CAL) 600 MG TABS tablet Take 600 mg by mouth daily.      . carvedilol (COREG) 12.5 MG tablet Take 12.5 mg by mouth daily.      . celecoxib (CELEBREX) 200 MG capsule Take 200 mg by mouth daily.      .  colesevelam (WELCHOL) 625 MG tablet Take 1,250 mg by mouth daily.      . CRESTOR 40 MG tablet Take 40 mg by mouth 2 (two) times daily.      Marland Kitchen esomeprazole (NEXIUM) 40 MG capsule Take 40 mg by mouth daily at 12 noon.      . ezetimibe (ZETIA) 10 MG tablet Take 10 mg by mouth daily.      . ferrous sulfate 325 (65 FE) MG tablet Take 325 mg by mouth 2 (two) times a week.      . hydrochlorothiazide (HYDRODIURIL) 25 MG tablet Take 25 mg by mouth daily.      . Multiple Vitamin (VITAMIN LIQUID PO) Take 5 mLs by mouth daily.      Marland Kitchen olmesartan (BENICAR) 40 MG tablet Take 40 mg by mouth daily.      Marland Kitchen omega-3 acid ethyl esters (LOVAZA) 1 G capsule Take 2 g by mouth 2 (two) times daily.      . potassium chloride (K-DUR,KLOR-CON) 10 MEQ tablet Take 10 mEq by mouth 2 (two) times daily.      . vitamin B-12 (CYANOCOBALAMIN) 500 MCG tablet Take 500 mcg by mouth daily.      Marland Kitchen  Vitamin D, Ergocalciferol, (DRISDOL) 50000 UNITS CAPS capsule Take 50,000 Units by mouth every 7 (seven) days.      Marland Kitchen warfarin (COUMADIN) 5 MG tablet Take 5-7.5 mg by mouth daily. Take 63m by mouth on Monday, Wednesday, and friday. Take 7.528mby mouth on Sunday, Tuesday,Thursday and Saturday.        Results for orders placed during the hospital encounter of 06/16/14 (from the past 48 hour(s))  PROTIME-INR     Status: Abnormal   Collection Time    06/16/14 12:15 PM      Result Value Ref Range   Prothrombin Time 18.1 (*) 11.6 - 15.2 seconds   INR 1.50 (*) 0.00 - 1.49  APTT     Status: None   Collection Time    06/16/14 12:15 PM      Result Value Ref Range   aPTT 32  24 - 37 seconds  CBC     Status: Abnormal   Collection Time    06/16/14 12:15 PM      Result Value Ref Range   WBC 5.9  4.0 - 10.5 K/uL   RBC 4.04  3.87 - 5.11 MIL/uL   Hemoglobin 11.8 (*) 12.0 - 15.0 g/dL   HCT 35.5 (*) 36.0 - 46.0 %   MCV 87.9  78.0 - 100.0 fL   MCH 29.2  26.0 - 34.0 pg   MCHC 33.2  30.0 - 36.0 g/dL   RDW 14.9  11.5 - 15.5 %   Platelets 182  150 -  400 K/uL  DIFFERENTIAL     Status: None   Collection Time    06/16/14 12:15 PM      Result Value Ref Range   Neutrophils Relative % 51  43 - 77 %   Neutro Abs 3.0  1.7 - 7.7 K/uL   Lymphocytes Relative 42  12 - 46 %   Lymphs Abs 2.5  0.7 - 4.0 K/uL   Monocytes Relative 5  3 - 12 %   Monocytes Absolute 0.3  0.1 - 1.0 K/uL   Eosinophils Relative 2  0 - 5 %   Eosinophils Absolute 0.1  0.0 - 0.7 K/uL   Basophils Relative 0  0 - 1 %   Basophils Absolute 0.0  0.0 - 0.1 K/uL  COMPREHENSIVE METABOLIC PANEL     Status: Abnormal   Collection Time    06/16/14 12:15 PM      Result Value Ref Range   Sodium 140  137 - 147 mEq/L   Potassium 3.8  3.7 - 5.3 mEq/L   Chloride 102  96 - 112 mEq/L   CO2 24  19 - 32 mEq/L   Glucose, Bld 103 (*) 70 - 99 mg/dL   BUN 20  6 - 23 mg/dL   Creatinine, Ser 0.88  0.50 - 1.10 mg/dL   Calcium 9.2  8.4 - 10.5 mg/dL   Total Protein 6.8  6.0 - 8.3 g/dL   Albumin 3.8  3.5 - 5.2 g/dL   AST 28  0 - 37 U/L   ALT 21  0 - 35 U/L   Alkaline Phosphatase 143 (*) 39 - 117 U/L   Total Bilirubin 0.4  0.3 - 1.2 mg/dL   GFR calc non Af Amer 62 (*) >90 mL/min   GFR calc Af Amer 72 (*) >90 mL/min   Comment: (NOTE)     The eGFR has been calculated using the CKD EPI equation.     This calculation has not been  validated in all clinical situations.     eGFR's persistently <90 mL/min signify possible Chronic Kidney     Disease.   Anion gap 14  5 - 15  I-STAT TROPOININ, ED     Status: None   Collection Time    06/16/14 12:22 PM      Result Value Ref Range   Troponin i, poc 0.01  0.00 - 0.08 ng/mL   Comment 3            Comment: Due to the release kinetics of cTnI,     a negative result within the first hours     of the onset of symptoms does not rule out     myocardial infarction with certainty.     If myocardial infarction is still suspected,     repeat the test at appropriate intervals.  CBG MONITORING, ED     Status: None   Collection Time    06/16/14 12:44 PM       Result Value Ref Range   Glucose-Capillary 96  70 - 99 mg/dL  TROPONIN I     Status: None   Collection Time    06/16/14  6:15 PM      Result Value Ref Range   Troponin I <0.30  <0.30 ng/mL   Comment:            Due to the release kinetics of cTnI,     a negative result within the first hours     of the onset of symptoms does not rule out     myocardial infarction with certainty.     If myocardial infarction is still suspected,     repeat the test at appropriate intervals.  TSH     Status: None   Collection Time    06/16/14  6:30 PM      Result Value Ref Range   TSH 2.950  0.350 - 4.500 uIU/mL  TROPONIN I     Status: None   Collection Time    06/16/14 11:56 PM      Result Value Ref Range   Troponin I <0.30  <0.30 ng/mL   Comment:            Due to the release kinetics of cTnI,     a negative result within the first hours     of the onset of symptoms does not rule out     myocardial infarction with certainty.     If myocardial infarction is still suspected,     repeat the test at appropriate intervals.  COMPREHENSIVE METABOLIC PANEL     Status: Abnormal   Collection Time    06/16/14 11:56 PM      Result Value Ref Range   Sodium 142  137 - 147 mEq/L   Potassium 3.7  3.7 - 5.3 mEq/L   Chloride 104  96 - 112 mEq/L   CO2 24  19 - 32 mEq/L   Glucose, Bld 100 (*) 70 - 99 mg/dL   BUN 18  6 - 23 mg/dL   Creatinine, Ser 0.88  0.50 - 1.10 mg/dL   Calcium 9.0  8.4 - 10.5 mg/dL   Total Protein 6.1  6.0 - 8.3 g/dL   Albumin 3.3 (*) 3.5 - 5.2 g/dL   AST 20  0 - 37 U/L   ALT 14  0 - 35 U/L   Alkaline Phosphatase 120 (*) 39 - 117 U/L   Total Bilirubin 0.5  0.3 - 1.2  mg/dL   GFR calc non Af Amer 62 (*) >90 mL/min   GFR calc Af Amer 72 (*) >90 mL/min   Comment: (NOTE)     The eGFR has been calculated using the CKD EPI equation.     This calculation has not been validated in all clinical situations.     eGFR's persistently <90 mL/min signify possible Chronic Kidney     Disease.    Anion gap 14  5 - 15  PROTIME-INR     Status: Abnormal   Collection Time    06/17/14  9:40 AM      Result Value Ref Range   Prothrombin Time 16.2 (*) 11.6 - 15.2 seconds   INR 1.30  0.00 - 1.49   Ct Head Wo Contrast  06/16/2014   CLINICAL DATA:  Post fall, on Coumadin, now with confusion  EXAM: CT HEAD WITHOUT CONTRAST  CT CERVICAL SPINE WITHOUT CONTRAST  TECHNIQUE: Multidetector CT imaging of the head and cervical spine was performed following the standard protocol without intravenous contrast. Multiplanar CT image reconstructions of the cervical spine were also generated.  COMPARISON:  Head CT - 07/10/2008  FINDINGS: CT HEAD FINDINGS  Advanced atrophy with diffuse sulcal prominence and centralized volume loss with mild commensurate ex vacuo dilatation of the ventricular system, likely progressed in the interval. Scattered minimal periventricular hypodensities compatible with microvascular ischemic disease. Bilateral basal ganglial calcifications. The gray-white differentiation is otherwise well maintained without CT evidence of acute large territory infarct. No intraparenchymal or extra-axial mass or hemorrhage. Normal configuration of the ventricles and basilar cisterns. No midline shift. There is under pneumatization of the bilateral frontal sinuses. The remaining paranasal sinuses and mastoid air cells are normally aerated. No air-fluid levels. Regional soft tissues appear normal. No displaced calvarial fracture.  CT CERVICAL SPINE FINDINGS  C1 to the superior endplate of T3 is imaged.  There is mild (approximately 2 mm) of anterolisthesis of C5 upon C6. The bilateral facets are normally aligned. The dens is normally positioned between the lateral masses of C1. Moderate degenerative change of the atlanto dental articulation. A tiny accessory ossicle is noted about the caudal aspect of the anterior arch of C1. Normal atlanto-axial articulations. Incidental note is made of partial ossification of the  anterior longitudinal ligament anterior to the C2-C3 intervertebral disc space.  No fracture or static subluxation of the cervical spine. Cervical vertebral body heights are preserved. Prevertebral soft tissues are normal.  There is mild to moderate multilevel DDD throughout the cervical spine, likely worse at C4-C5 and C6-C7 with disc space height loss, endplate irregularity and sclerosis.  There is diffuse enlargement of the thyroid gland with a suspected approximately 2.7 x 1.6 cm partially calcified left-sided thyroid nodule (image 54, series 301 as well as an ill-defined approximately 1.9 x 1.8 cm hypo attenuating nodule within the thyroid isthmus (image 61, series 302) and an ill-defined approximately 3.0 x 2.4 cm nodule within the right lobe of the thyroid (image 63, series 302).  Atherosclerotic plaque within the bilateral carotid bulbs, left greater than right. No bulky cervical adenopathy on this noncontrast examination. Limited visualization of lung apices is normal.  IMPRESSION: 1. Advanced atrophy and mild microvascular ischemic disease without acute intracranial process. 2. No fracture or static subluxation of the cervical spine. 3. Mild to moderate multilevel DDD throughout the cervical spine, likely worse at C4-C5 and C6-C7. 4. Diffusely enlarged thyroid gland with multiple bilateral indeterminate thyroid nodules. Further evaluation with nonemergent thyroid ultrasound could be performed  as clinically indicated.   Electronically Signed   By: Sandi Mariscal M.D.   On: 06/16/2014 13:34   Ct Cervical Spine Wo Contrast  06/16/2014   CLINICAL DATA:  Post fall, on Coumadin, now with confusion  EXAM: CT HEAD WITHOUT CONTRAST  CT CERVICAL SPINE WITHOUT CONTRAST  TECHNIQUE: Multidetector CT imaging of the head and cervical spine was performed following the standard protocol without intravenous contrast. Multiplanar CT image reconstructions of the cervical spine were also generated.  COMPARISON:  Head CT -  07/10/2008  FINDINGS: CT HEAD FINDINGS  Advanced atrophy with diffuse sulcal prominence and centralized volume loss with mild commensurate ex vacuo dilatation of the ventricular system, likely progressed in the interval. Scattered minimal periventricular hypodensities compatible with microvascular ischemic disease. Bilateral basal ganglial calcifications. The gray-white differentiation is otherwise well maintained without CT evidence of acute large territory infarct. No intraparenchymal or extra-axial mass or hemorrhage. Normal configuration of the ventricles and basilar cisterns. No midline shift. There is under pneumatization of the bilateral frontal sinuses. The remaining paranasal sinuses and mastoid air cells are normally aerated. No air-fluid levels. Regional soft tissues appear normal. No displaced calvarial fracture.  CT CERVICAL SPINE FINDINGS  C1 to the superior endplate of T3 is imaged.  There is mild (approximately 2 mm) of anterolisthesis of C5 upon C6. The bilateral facets are normally aligned. The dens is normally positioned between the lateral masses of C1. Moderate degenerative change of the atlanto dental articulation. A tiny accessory ossicle is noted about the caudal aspect of the anterior arch of C1. Normal atlanto-axial articulations. Incidental note is made of partial ossification of the anterior longitudinal ligament anterior to the C2-C3 intervertebral disc space.  No fracture or static subluxation of the cervical spine. Cervical vertebral body heights are preserved. Prevertebral soft tissues are normal.  There is mild to moderate multilevel DDD throughout the cervical spine, likely worse at C4-C5 and C6-C7 with disc space height loss, endplate irregularity and sclerosis.  There is diffuse enlargement of the thyroid gland with a suspected approximately 2.7 x 1.6 cm partially calcified left-sided thyroid nodule (image 54, series 301 as well as an ill-defined approximately 1.9 x 1.8 cm hypo  attenuating nodule within the thyroid isthmus (image 61, series 302) and an ill-defined approximately 3.0 x 2.4 cm nodule within the right lobe of the thyroid (image 63, series 302).  Atherosclerotic plaque within the bilateral carotid bulbs, left greater than right. No bulky cervical adenopathy on this noncontrast examination. Limited visualization of lung apices is normal.  IMPRESSION: 1. Advanced atrophy and mild microvascular ischemic disease without acute intracranial process. 2. No fracture or static subluxation of the cervical spine. 3. Mild to moderate multilevel DDD throughout the cervical spine, likely worse at C4-C5 and C6-C7. 4. Diffusely enlarged thyroid gland with multiple bilateral indeterminate thyroid nodules. Further evaluation with nonemergent thyroid ultrasound could be performed as clinically indicated.   Electronically Signed   By: Sandi Mariscal M.D.   On: 06/16/2014 13:34   Dg Chest Port 1 View  06/16/2014   CLINICAL DATA:  Recent traumatic injury with pain  EXAM: PORTABLE CHEST - 1 VIEW  COMPARISON:  09/17/2008  FINDINGS: The heart size and mediastinal contours are within normal limits. Both lungs are clear. The visualized skeletal structures are unremarkable. An old left clavicular fracture is noted.  IMPRESSION: No active disease.   Electronically Signed   By: Inez Catalina M.D.   On: 06/16/2014 14:29    Review Of Systems Review  of Systems  Constitutional: Negative for fever, chills, weight loss, malaise/fatigue and diaphoresis.  Eyes: Negative for blurred vision.  Respiratory: Negative for cough and shortness of breath.  Cardiovascular: Negative for chest pain and palpitations.  Gastrointestinal: Negative for nausea and vomiting.  Genitourinary: Negative for dysuria, frequency and hematuria.  Has chronic urinary incontinence  Musculoskeletal: Positive for neck pain.  Skin: Negative for rash.  Neurological: Positive for loss of consciousness. Negative for dizziness, tremors,  weakness and headaches.  Psychiatric/Behavioral: Negative for depression   Blood pressure 164/70, pulse 65, temperature 98.2 F (36.8 C), temperature source Oral, resp. rate 18, height 4' 8" (1.422 m), weight 69.037 kg (152 lb 3.2 oz), SpO2 98.00%.  Physical Exam  Nursing note and vitals reviewed.  Constitutional: She is short and well-nourished.  HENT: Head: Normocephalic, Blue eyes, PERL, EOMI..  Neck: C-collar in place Cardiovascular: Normal rate and regular rhythm. II/VI systolic murmur. DP pulses present but diminished bilaterally Respiratory: Effort normal and breath sounds normal. No respiratory distress. She has no wheezes. She has no rales.  GI: Soft. Bowel sounds are normal. She exhibits no distension. There is no tenderness. There is no rebound and no guarding.  Musculoskeletal: She exhibits trace edema and no tenderness.  Neurological: She is alert and oriented to person, place, and time. Moves all 4 extremities. Skin: Skin is warm and dry. No erythema.  Psychiatric: She has a normal mood and affect. Her behavior is normal.   Assessment/Plan Syncope Fall Chest pain Thyroid Goiter Hypertension PVD Chronic anemia  Agree with cardiac work-up/carotid doppler/echocardiogram, TSH and Nuclear stress test. Low dose B-blocker as tolerated.   Birdie Riddle, MD  06/17/2014, 3:21 PM

## 2014-06-17 NOTE — Progress Notes (Signed)
Utilization review completed.  

## 2014-06-18 ENCOUNTER — Inpatient Hospital Stay (HOSPITAL_COMMUNITY): Payer: Medicare Other

## 2014-06-18 ENCOUNTER — Encounter (HOSPITAL_COMMUNITY): Payer: Medicare Other

## 2014-06-18 DIAGNOSIS — R55 Syncope and collapse: Secondary | ICD-10-CM

## 2014-06-18 DIAGNOSIS — M542 Cervicalgia: Secondary | ICD-10-CM

## 2014-06-18 LAB — THYROID PEROXIDASE ANTIBODY: Thyroperoxidase Ab SerPl-aCnc: 2774 IU/mL — ABNORMAL HIGH (ref <35.0)

## 2014-06-18 LAB — PROTIME-INR
INR: 1.7 — ABNORMAL HIGH (ref 0.00–1.49)
Prothrombin Time: 20 seconds — ABNORMAL HIGH (ref 11.6–15.2)

## 2014-06-18 MED ORDER — TECHNETIUM TC 99M SESTAMIBI GENERIC - CARDIOLITE
10.0000 | Freq: Once | INTRAVENOUS | Status: AC | PRN
  Administered 2014-06-18: 10 via INTRAVENOUS

## 2014-06-18 MED ORDER — REGADENOSON 0.4 MG/5ML IV SOLN
INTRAVENOUS | Status: AC
  Administered 2014-06-18: 0.4 mg via INTRAVENOUS
  Filled 2014-06-18: qty 5

## 2014-06-18 MED ORDER — HYDROCHLOROTHIAZIDE 25 MG PO TABS
12.5000 mg | ORAL_TABLET | Freq: Every day | ORAL | Status: DC
  Filled 2014-06-18: qty 0.5

## 2014-06-18 MED ORDER — WARFARIN SODIUM 7.5 MG PO TABS
7.5000 mg | ORAL_TABLET | Freq: Once | ORAL | Status: AC
  Administered 2014-06-18: 7.5 mg via ORAL
  Filled 2014-06-18: qty 1

## 2014-06-18 MED ORDER — TECHNETIUM TC 99M SESTAMIBI GENERIC - CARDIOLITE
30.0000 | Freq: Once | INTRAVENOUS | Status: AC | PRN
  Administered 2014-06-18: 30 via INTRAVENOUS

## 2014-06-18 NOTE — Progress Notes (Signed)
Dr Algie CofferKadakia at bedside 1 min after Lexi,

## 2014-06-18 NOTE — Discharge Instructions (Addendum)
It was a pleasure taking care of you. You were hospitalized and treated for a fall with syncope. We think that your fall was related to your blood pressure being too low.  We have stopped your Hydrochlorothiazide medications and reduced your Coreg dose.  It is very important that your follow up with your primary care doctor for a blood pressure recheck.  You may continue using the soft neck collar to relief the pain in your neck. You may take it off as needed and you may no longer use it once your pain is gone.  You may take Tylenol as needed for pain. Avoid medications in the class of NSAIDs, these are aspirin, Motrin, Ibuprofen, Advil.    Syncope Syncope means a person passes out (faints). The person usually wakes up in less than 5 minutes. It is important to seek medical care for syncope. HOME CARE  Have someone stay with you until you feel normal.  Do not drive, use machines, or play sports until your doctor says it is okay.  Keep all doctor visits as told.  Lie down when you feel like you might pass out. Take deep breaths. Wait until you feel normal before standing up.  Drink enough fluids to keep your pee (urine) clear or pale yellow.  If you take blood pressure or heart medicine, get up slowly. Take several minutes to sit and then stand. GET HELP RIGHT AWAY IF:   You have a severe headache.  You have pain in the chest, belly (abdomen), or back.  You are bleeding from the mouth or butt (rectum).  You have black or tarry poop (stool).  You have an irregular or very fast heartbeat.  You have pain with breathing.  You keep passing out, or you have shaking (seizures) when you pass out.  You pass out when sitting or lying down.  You feel confused.  You have trouble walking.  You have severe weakness.  You have vision problems. If you fainted, call your local emergency services (911 in [place] Do not drive yourself to the hospital. MAKE SURE YOU:   Understand these  instructions.  Will watch your condition.  Will get help right away if you are not doing well or get worse. Document Released: 04/17/2008 Document Revised: 04/30/2012 Document Reviewed: 12/29/2011 [facility] Patient Information 2015 [place]. This information is not intended to replace advice given to you by your health care provider. Make sure you discuss any questions you have with your health care provider.       Information on my medicine - Coumadin   (Warfarin)  This medication education was reviewed with me or my healthcare representative as part of my discharge preparation.  The pharmacist that spoke with me during my hospital stay was:  Renaee MundaHiatt, Kendra P, [facility]  Why was Coumadin prescribed for you? Coumadin was prescribed for you because you have a blood clot or a medical condition that can cause an increased risk of forming blood clots. Blood clots can cause serious health problems by blocking the flow of blood to the heart, lung, or brain. Coumadin can prevent harmful blood clots from forming. As a reminder your indication for Coumadin is:   Select from menu  What test will check on my response to Coumadin? While on Coumadin (warfarin) you will need to have an INR test regularly to ensure that your dose is keeping you in the desired range. The INR (international normalized ratio) number is calculated from the result of the laboratory  test called prothrombin time (PT).  If an INR APPOINTMENT HAS NOT ALREADY BEEN MADE FOR YOU please schedule an appointment to have this lab work done by your health care provider within 7 days. Your INR goal is usually a number between:  2 to 3 or your provider may give you a more narrow range like 2-2.5.  Ask your health care provider during an office visit what your goal INR is.  What  do you need to  know  About  COUMADIN? Take Coumadin (warfarin) exactly as prescribed by your healthcare provider about the same time each day.  DO NOT stop taking  without talking to the doctor who prescribed the medication.  Stopping without other blood clot prevention medication to take the place of Coumadin may increase your risk of developing a new clot or stroke.  Get refills before you run out.  What do you do if you miss a dose? If you miss a dose, take it as soon as you remember on the same day then continue your regularly scheduled regimen the next day.  Do not take two doses of Coumadin at the same time.  Important Safety Information A possible side effect of Coumadin (Warfarin) is an increased risk of bleeding. You should call your healthcare provider right away if you experience any of the following:   Bleeding from an injury or your nose that does not stop.   Unusual colored urine (red or dark brown) or unusual colored stools (red or black).   Unusual bruising for unknown reasons.   A serious fall or if you hit your head (even if there is no bleeding).  Some foods or medicines interact with Coumadin (warfarin) and might alter your response to warfarin. To help avoid this:   Eat a balanced diet, maintaining a consistent amount of Vitamin K.   Notify your provider about major diet changes you plan to make.   Avoid alcohol or limit your intake to 1 drink for women and 2 drinks for men per day. (1 drink is 5 oz. wine, 12 oz. beer, or 1.5 oz. liquor.)  Make sure that ANY health care provider who prescribes medication for you knows that you are taking Coumadin (warfarin).  Also make sure the healthcare provider who is monitoring your Coumadin knows when you have started a new medication including herbals and non-prescription products.  Coumadin (Warfarin)  Major Drug Interactions  Increased Warfarin Effect Decreased Warfarin Effect  Alcohol (large quantities) Antibiotics (esp. Septra/Bactrim, Flagyl, Cipro) Amiodarone (Cordarone) Aspirin (ASA) Cimetidine (Tagamet) Megestrol (Megace) NSAIDs (ibuprofen, naproxen, etc.) Piroxicam  (Feldene) Propafenone (Rythmol SR) Propranolol (Inderal) Isoniazid (INH) Posaconazole (Noxafil) Barbiturates (Phenobarbital) Carbamazepine (Tegretol) Chlordiazepoxide (Librium) Cholestyramine (Questran) Griseofulvin Oral Contraceptives Rifampin Sucralfate (Carafate) Vitamin K   Coumadin (Warfarin) Major Herbal Interactions  Increased Warfarin Effect Decreased Warfarin Effect  Garlic Ginseng Ginkgo biloba Coenzyme Q10 Green tea St. Johns wort    Coumadin (Warfarin) FOOD Interactions  Eat a consistent number of servings per week of foods HIGH in Vitamin K (1 serving =  cup)  Collards (cooked, or boiled & drained) Kale (cooked, or boiled & drained) Mustard greens (cooked, or boiled & drained) Parsley *serving size only =  cup Spinach (cooked, or boiled & drained) Swiss chard (cooked, or boiled & drained) Turnip greens (cooked, or boiled & drained)  Eat a consistent number of servings per week of foods MEDIUM-HIGH in Vitamin K (1 serving = 1 cup)  Asparagus (cooked, or boiled & drained) Broccoli (cooked, boiled &  drained, or raw & chopped) Brussel sprouts (cooked, or boiled & drained) *serving size only =  cup Lettuce, raw (green leaf, endive, romaine) Spinach, raw Turnip greens, raw & chopped   These websites have more information on Coumadin (warfarin):  FailFactory.se; VeganReport.com.au;

## 2014-06-18 NOTE — Progress Notes (Signed)
Subjective: No acute events overnight.   The patient remains in an immobile c-spine. She is eager for a soft collar c-spine for comfort. She does not complain of neck pain and she says she did not have pain when she readjusted the hard c-spine earlier today. She also does not have any complaints of SOB, chest pain, or palpitations. She states she is feeling well and does not want the nursing staff to assist her to the restroom as she feels she is steady on her feet.  Physical therapy was in the room and will provide further insight.   Objective: Vital signs in last 24 hours: Filed Vitals:   06/18/14 1035 06/18/14 1037 06/18/14 1039 06/18/14 1041  BP: 161/70 182/72 209/67 209/66  Pulse: 100 88 83 82  Temp:      TempSrc:      Resp: 16 16 16 16   Height:      Weight:      SpO2:       Weight change: -2.841 kg (-6 lb 4.2 oz)  Intake/Output Summary (Last 24 hours) at 06/18/14 1133 Last data filed at 06/18/14 0829  Gross per 24 hour  Intake    222 ml  Output      0 ml  Net    222 ml   Constitutional: Patient is wearing an immobile c-spine and is in NAD.   HEENT:  Head: Normocephalic Eyes: Conjunctivae are normal. Pupils are equal, round, and reactive to light.  Cardiovascular: Regular rate and rhythm. No murmurs, rubs, or gallops.  Pulmonary/Chest: Effort normal. No respiratory distress. CTAB.  Abdominal: Soft. No abdominal distention or tenderness. Normoactive bowel sounds. Neurological: She is alert and oriented to person, place, and time. No cranial nerve deficit. Coordination normal.  Ext: No bruising, no edema, pedal pulses symmetrical but diminished  Lab Results: Basic Metabolic Panel:  Recent Labs Lab 06/16/14 1215 06/16/14 2356  NA 140 142  K 3.8 3.7  CL 102 104  CO2 24 24  GLUCOSE 103* 100*  BUN 20 18  CREATININE 0.88 0.88  CALCIUM 9.2 9.0   Liver Function Tests:  Recent Labs Lab 06/16/14 1215 06/16/14 2356  AST 28 20  ALT 21 14  ALKPHOS 143* 120*    BILITOT 0.4 0.5  PROT 6.8 6.1  ALBUMIN 3.8 3.3*   No results found for this basename: LIPASE, AMYLASE,  in the last 168 hours No results found for this basename: AMMONIA,  in the last 168 hours CBC:  Recent Labs Lab 06/16/14 1215  WBC 5.9  NEUTROABS 3.0  HGB 11.8*  HCT 35.5*  MCV 87.9  PLT 182   Cardiac Enzymes:  Recent Labs Lab 06/16/14 1815 06/16/14 2356  TROPONINI <0.30 <0.30   BNP: No results found for this basename: PROBNP,  in the last 168 hours D-Dimer: No results found for this basename: DDIMER,  in the last 168 hours CBG:  Recent Labs Lab 06/16/14 1244  GLUCAP 96   Hemoglobin A1C: No results found for this basename: HGBA1C,  in the last 168 hours Fasting Lipid Panel: No results found for this basename: CHOL, HDL, LDLCALC, TRIG, CHOLHDL, LDLDIRECT,  in the last 168 hours Thyroid Function Tests:  Recent Labs Lab 06/16/14 1830  TSH 2.950   Coagulation:  Recent Labs Lab 06/16/14 1215 06/17/14 0940 06/18/14 0742  LABPROT 18.1* 16.2* 20.0*  INR 1.50* 1.30 1.70*   Anemia Panel: No results found for this basename: VITAMINB12, FOLATE, FERRITIN, TIBC, IRON, RETICCTPCT,  in the last 168  hours Urine Drug Screen: Drugs of Abuse  No results found for this basename: labopia, cocainscrnur, labbenz, amphetmu, thcu, labbarb    Alcohol Level: No results found for this basename: ETH,  in the last 168 hours Urinalysis: No results found for this basename: COLORURINE, APPERANCEUR, LABSPEC, PHURINE, GLUCOSEU, HGBUR, BILIRUBINUR, KETONESUR, PROTEINUR, UROBILINOGEN, NITRITE, LEUKOCYTESUR,  in the last 168 hours Misc. Labs:  Micro Results: No results found for this or any previous visit (from the past 240 hour(s)). Studies/Results: Ct Head Wo Contrast  06/16/2014   CLINICAL DATA:  Post fall, on Coumadin, now with confusion  EXAM: CT HEAD WITHOUT CONTRAST  CT CERVICAL SPINE WITHOUT CONTRAST  TECHNIQUE: Multidetector CT imaging of the head and cervical spine was  performed following the standard protocol without intravenous contrast. Multiplanar CT image reconstructions of the cervical spine were also generated.  COMPARISON:  Head CT - 07/10/2008  FINDINGS: CT HEAD FINDINGS  Advanced atrophy with diffuse sulcal prominence and centralized volume loss with mild commensurate ex vacuo dilatation of the ventricular system, likely progressed in the interval. Scattered minimal periventricular hypodensities compatible with microvascular ischemic disease. Bilateral basal ganglial calcifications. The gray-white differentiation is otherwise well maintained without CT evidence of acute large territory infarct. No intraparenchymal or extra-axial mass or hemorrhage. Normal configuration of the ventricles and basilar cisterns. No midline shift. There is under pneumatization of the bilateral frontal sinuses. The remaining paranasal sinuses and mastoid air cells are normally aerated. No air-fluid levels. Regional soft tissues appear normal. No displaced calvarial fracture.  CT CERVICAL SPINE FINDINGS  C1 to the superior endplate of T3 is imaged.  There is mild (approximately 2 mm) of anterolisthesis of C5 upon C6. The bilateral facets are normally aligned. The dens is normally positioned between the lateral masses of C1. Moderate degenerative change of the atlanto dental articulation. A tiny accessory ossicle is noted about the caudal aspect of the anterior arch of C1. Normal atlanto-axial articulations. Incidental note is made of partial ossification of the anterior longitudinal ligament anterior to the C2-C3 intervertebral disc space.  No fracture or static subluxation of the cervical spine. Cervical vertebral body heights are preserved. Prevertebral soft tissues are normal.  There is mild to moderate multilevel DDD throughout the cervical spine, likely worse at C4-C5 and C6-C7 with disc space height loss, endplate irregularity and sclerosis.  There is diffuse enlargement of the thyroid  gland with a suspected approximately 2.7 x 1.6 cm partially calcified left-sided thyroid nodule (image 54, series 301 as well as an ill-defined approximately 1.9 x 1.8 cm hypo attenuating nodule within the thyroid isthmus (image 61, series 302) and an ill-defined approximately 3.0 x 2.4 cm nodule within the right lobe of the thyroid (image 63, series 302).  Atherosclerotic plaque within the bilateral carotid bulbs, left greater than right. No bulky cervical adenopathy on this noncontrast examination. Limited visualization of lung apices is normal.  IMPRESSION: 1. Advanced atrophy and mild microvascular ischemic disease without acute intracranial process. 2. No fracture or static subluxation of the cervical spine. 3. Mild to moderate multilevel DDD throughout the cervical spine, likely worse at C4-C5 and C6-C7. 4. Diffusely enlarged thyroid gland with multiple bilateral indeterminate thyroid nodules. Further evaluation with nonemergent thyroid ultrasound could be performed as clinically indicated.   Electronically Signed   By: Simonne Come M.D.   On: 06/16/2014 13:34   Ct Cervical Spine Wo Contrast  06/16/2014   CLINICAL DATA:  Post fall, on Coumadin, now with confusion  EXAM: CT HEAD WITHOUT  CONTRAST  CT CERVICAL SPINE WITHOUT CONTRAST  TECHNIQUE: Multidetector CT imaging of the head and cervical spine was performed following the standard protocol without intravenous contrast. Multiplanar CT image reconstructions of the cervical spine were also generated.  COMPARISON:  Head CT - 07/10/2008  FINDINGS: CT HEAD FINDINGS  Advanced atrophy with diffuse sulcal prominence and centralized volume loss with mild commensurate ex vacuo dilatation of the ventricular system, likely progressed in the interval. Scattered minimal periventricular hypodensities compatible with microvascular ischemic disease. Bilateral basal ganglial calcifications. The gray-white differentiation is otherwise well maintained without CT evidence of  acute large territory infarct. No intraparenchymal or extra-axial mass or hemorrhage. Normal configuration of the ventricles and basilar cisterns. No midline shift. There is under pneumatization of the bilateral frontal sinuses. The remaining paranasal sinuses and mastoid air cells are normally aerated. No air-fluid levels. Regional soft tissues appear normal. No displaced calvarial fracture.  CT CERVICAL SPINE FINDINGS  C1 to the superior endplate of T3 is imaged.  There is mild (approximately 2 mm) of anterolisthesis of C5 upon C6. The bilateral facets are normally aligned. The dens is normally positioned between the lateral masses of C1. Moderate degenerative change of the atlanto dental articulation. A tiny accessory ossicle is noted about the caudal aspect of the anterior arch of C1. Normal atlanto-axial articulations. Incidental note is made of partial ossification of the anterior longitudinal ligament anterior to the C2-C3 intervertebral disc space.  No fracture or static subluxation of the cervical spine. Cervical vertebral body heights are preserved. Prevertebral soft tissues are normal.  There is mild to moderate multilevel DDD throughout the cervical spine, likely worse at C4-C5 and C6-C7 with disc space height loss, endplate irregularity and sclerosis.  There is diffuse enlargement of the thyroid gland with a suspected approximately 2.7 x 1.6 cm partially calcified left-sided thyroid nodule (image 54, series 301 as well as an ill-defined approximately 1.9 x 1.8 cm hypo attenuating nodule within the thyroid isthmus (image 61, series 302) and an ill-defined approximately 3.0 x 2.4 cm nodule within the right lobe of the thyroid (image 63, series 302).  Atherosclerotic plaque within the bilateral carotid bulbs, left greater than right. No bulky cervical adenopathy on this noncontrast examination. Limited visualization of lung apices is normal.  IMPRESSION: 1. Advanced atrophy and mild microvascular ischemic  disease without acute intracranial process. 2. No fracture or static subluxation of the cervical spine. 3. Mild to moderate multilevel DDD throughout the cervical spine, likely worse at C4-C5 and C6-C7. 4. Diffusely enlarged thyroid gland with multiple bilateral indeterminate thyroid nodules. Further evaluation with nonemergent thyroid ultrasound could be performed as clinically indicated.   Electronically Signed   By: Simonne ComeJohn  Watts M.D.   On: 06/16/2014 13:34   Dg Chest Port 1 View  06/16/2014   CLINICAL DATA:  Recent traumatic injury with pain  EXAM: PORTABLE CHEST - 1 VIEW  COMPARISON:  09/17/2008  FINDINGS: The heart size and mediastinal contours are within normal limits. Both lungs are clear. The visualized skeletal structures are unremarkable. An old left clavicular fracture is noted.  IMPRESSION: No active disease.   Electronically Signed   By: Alcide CleverMark  Lukens M.D.   On: 06/16/2014 14:29   Dg Cerv Spine Flex&ext Only  06/17/2014   CLINICAL DATA:  Whiplash.  EXAM: CERVICAL SPINE - FLEXION AND EXTENSION VIEWS ONLY  COMPARISON:  CT scan dated 06/16/2014  FINDINGS: There is no fracture or cyst abnormal subluxation with flexion or extension. There is slight degenerative narrowing of  the C5-6 disc space. No prevertebral soft tissue swelling.  Diffuse osteopenia.  IMPRESSION: No acute abnormalities.  No abnormal subluxation.   Electronically Signed   By: Geanie Cooley M.D.   On: 06/17/2014 18:50   Medications: I have reviewed the patient's current medications. Scheduled Meds: . aspirin EC  81 mg Oral Daily  . atorvastatin  80 mg Oral q1800  . calcium carbonate  1 tablet Oral Q breakfast  . carvedilol  3.125 mg Oral BID WC  . colesevelam  1,250 mg Oral Daily  . vitamin B-12  500 mcg Oral Daily  . ezetimibe  10 mg Oral Daily  . ferrous sulfate  325 mg Oral Once per day on Mon Thu  . hydrochlorothiazide  25 mg Oral Daily  . irbesartan  300 mg Oral Daily  . omega-3 acid ethyl esters  2 g Oral BID  .  pantoprazole  40 mg Oral Daily  . sodium chloride  3 mL Intravenous Q12H  . sodium chloride  3 mL Intravenous Q12H  . warfarin  7.5 mg Oral ONCE-1800  . Warfarin - Pharmacist Dosing Inpatient   Does not apply q1800   Continuous Infusions:  PRN Meds:.sodium chloride, acetaminophen, acetaminophen, albuterol, sodium chloride Assessment/Plan: Principal Problem:   Syncope Active Problems:   CAD (coronary artery disease)   PAD (peripheral artery disease)   HTN (hypertension)   Carotid artery disease   Other and unspecified hyperlipidemia   GERD (gastroesophageal reflux disease)   Anemia   Enlarged thyroid gland  A: Patient is a 79 yo female with cardiomegaly, 80% carotid stenosis, and PVD admitted after a syncopal event.   Syncope: Etiology is unclear, but is likely cardiac due to PMH of cardiac issues. Differential includes cardiac arrhythmia, medication induced syncope, hypoglycemic event (2/2 patient missing breakfast), worsening carotid artery disease (80% stenosis in left carotid artery), or autonomic dysfunction. She has no neurologic deficits on physical exam and CT head negative for intracranial process. Seizure unlikely given no hx of seizure, no seizure-like activity, no tongue biting, and no loss of bowel/bladder control. Troponin x3 negative, EKG shows no changes. Cardiology was consulted and agreed with full cardiac workup (nuclear stress test, 2D Echo, and bilateral carotid dopplers). Stress test results  -Will continue telemetry  -Cardiology following -Carvedilol restarted -Continue PT   Cervical spine pain: She continues to be in immobilizing C-collar, but will be switched to a soft collar today for comfort. Neurosurgery was called and they recommended switching the patient to a soft collar if flexion/extension cervical spine x-ray was normal/benign. Her cervical spine flexion/extension xray is unremarkable and CT cervical spine from 8/4 is negative for fractures or  subluxation. No need for formal NSG consult at this time. -Tylenol PRN for pain   Cardiomegaly: Unable to obtain cardiology records from cardiologist in [facility] but pt was last seen in 2010.  -Continue home ASA 81mg , statin  -Cardiology following   HTN: 180/67 on admission, 146/70 currently. Patient is not orthostatic. Continue to monitor.  -Will restart carvedilol after patient returns from nuclear stress test -Continue Irbesartan 300mg  PO daily, HCTZ 25mg  daily   PVD: Records obtained on PVD stent placement. She is on [facility] with Warfarin. INR 1.5 on presentation, now 1.3. Subtherapeutic. -Warfarin per pharmacy   FEN/GI:  Heart Healthy Protonix 40mg  daily   Dispo: Disposition is deferred at this time, awaiting improvement of current medical problems. Anticipated discharge in approximately 1-2 day(s).    This is a Psychologist, occupational Note.  The care of the patient was discussed with Dr. Garald Braver and the assessment and plan formulated with their assistance.  Please see their attached note for official documentation of the daily encounter.   LOS: 2 days   Lillia Carmel, Med Student 06/18/2014, 11:33 AM

## 2014-06-18 NOTE — Progress Notes (Signed)
VASCULAR LAB PRELIMINARY  PRELIMINARY  PRELIMINARY  PRELIMINARY  Carotid duplex  completed.    Preliminary report:  Right:  1-39% ICA stenosis.  Left:  80%-99%  ICA stenosis.  Bilateral:  Vertebral artery flow is antegrade.     Theophil Thivierge, RVT 06/18/2014, 4:20 PM

## 2014-06-18 NOTE — Progress Notes (Signed)
ANTICOAGULATION CONSULT NOTE - Follow Up Consult  Pharmacy Consult for Coumadin Indication: Hx PVD  Allergies  Allergen Reactions  . Contrast Media [Iodinated Diagnostic Agents] Itching, Rash and Other (See Comments)    Skin was peeling   . Latex Itching and Rash    Patient Measurements: Height: 4\' 8"  (142.2 cm) Weight: 151 lb 12.8 oz (68.856 kg) IBW/kg (Calculated) : 36.3 Heparin Dosing Weight:   Vital Signs: Temp: 97.5 F (36.4 C) (08/06 1314) Temp src: Oral (08/06 1314) BP: 161/75 mmHg (08/06 1314) Pulse Rate: 66 (08/06 1314)  Labs:  Recent Labs  06/16/14 1215 06/16/14 1815 06/16/14 2356 06/17/14 0940 06/18/14 0742  HGB 11.8*  --   --   --   --   HCT 35.5*  --   --   --   --   PLT 182  --   --   --   --   APTT 32  --   --   --   --   LABPROT 18.1*  --   --  16.2* 20.0*  INR 1.50*  --   --  1.30 1.70*  CREATININE 0.88  --  0.88  --   --   TROPONINI  --  <0.30 <0.30  --   --     Estimated Creatinine Clearance: 41.7 ml/min (by C-G formula based on Cr of 0.88).   Medications:  Scheduled:  . aspirin EC  81 mg Oral Daily  . atorvastatin  80 mg Oral q1800  . calcium carbonate  1 tablet Oral Q breakfast  . carvedilol  3.125 mg Oral BID WC  . colesevelam  1,250 mg Oral Daily  . vitamin B-12  500 mcg Oral Daily  . ezetimibe  10 mg Oral Daily  . ferrous sulfate  325 mg Oral Once per day on Mon Thu  . hydrochlorothiazide  25 mg Oral Daily  . irbesartan  300 mg Oral Daily  . omega-3 acid ethyl esters  2 g Oral BID  . pantoprazole  40 mg Oral Daily  . sodium chloride  3 mL Intravenous Q12H  . sodium chloride  3 mL Intravenous Q12H  . Warfarin - Pharmacist Dosing Inpatient   Does not apply q1800    Assessment: 79yo female admitted with syncopal event, currently being evaluated for this.  INR 1.7 this AM, no CBC.  No bleeding noted.  CT head (-).  2D-ECHO completed today, awaiting results.    Goal of Therapy:  INR 2-3 Monitor platelets by anticoagulation  protocol: Yes   Plan:  1-  Repeat Coumadin 7.5mg  today 2-  F/U INR in AM  Marisue HumbleKendra Aariana Shankland, PharmD Clinical Pharmacist [facility]

## 2014-06-18 NOTE — Progress Notes (Signed)
I have seen the patient and reviewed the daily progress note by Lillia Carmel MS 4 and discussed the care of the patient with them.  See below for documentation of my findings, assessment, and plans.  Subjective: No overnight events. Denies SOB, chest pain, palpitations, or dizziness. She has been walking with PT with no weakness.  Objective: Vital signs in last 24 hours: Filed Vitals:   06/18/14 1037 06/18/14 1039 06/18/14 1041 06/18/14 1314  BP: 182/72 209/67 209/66 161/75  Pulse: 88 83 82 66  Temp:    97.5 F (36.4 C)  TempSrc:    Oral  Resp: 16 16 16 18   Height:      Weight:      SpO2:    97%   Weight change: -6 lb 4.2 oz (-2.841 kg)  Intake/Output Summary (Last 24 hours) at 06/18/14 2006 Last data filed at 06/18/14 0829  Gross per 24 hour  Intake      0 ml  Output      0 ml  Net      0 ml   Vitals reviewed.  General: resting in bed, in NAD  HEENT: PERRL, EOMI, no scleral icterus, dry MM. Edentulous, mild dysarthria. Immobilizing C-collar in place. Thyroid gland not examined 22/ to C-collar.  Cardiac: RRR, no rubs, murmurs or gallops. DP pulses diminished bilaterally.  Pulm: clear to auscultation bilaterally, no wheezes, rales, or rhonchi  Abd: soft, nontender, nondistended, BS present  Ext: warm and well perfused, no pedal edema  Neuro: alert and oriented X3, moves all extremities voluntarily  Lab Results: Reviewed and documented in Electronic Record Micro Results: Reviewed and documented in Electronic Record Studies/Results: Reviewed and documented in Electronic Record Medications: I have reviewed the patient's current medications. Scheduled Meds: . aspirin EC  81 mg Oral Daily  . atorvastatin  80 mg Oral q1800  . calcium carbonate  1 tablet Oral Q breakfast  . carvedilol  3.125 mg Oral BID WC  . colesevelam  1,250 mg Oral Daily  . vitamin B-12  500 mcg Oral Daily  . ezetimibe  10 mg Oral Daily  . ferrous sulfate  325 mg Oral Once per day on Mon Thu  .  hydrochlorothiazide  25 mg Oral Daily  . irbesartan  300 mg Oral Daily  . omega-3 acid ethyl esters  2 g Oral BID  . pantoprazole  40 mg Oral Daily  . sodium chloride  3 mL Intravenous Q12H  . sodium chloride  3 mL Intravenous Q12H  . Warfarin - Pharmacist Dosing Inpatient   Does not apply q1800   Continuous Infusions:  PRN Meds:.sodium chloride, acetaminophen, acetaminophen, albuterol, sodium chloride Assessment/Plan: 79 yo woamn with cardiomegaly, 80% carotid stenosis, and PVD admitted after a syncopal event.   Syncope: Etiology unclear. Likely cardiac due to PMH of cardiac issues. Differential includes cardiac arrhythmia (less likely with no evidence on this on telemetry), medication induced syncope, hypoglycemic event (2/2 patient missing breakfast), worsening carotid artery disease (80-99% stenosis in left carotid artery), or autonomic dysfunction. She has no neurologic deficits on physical exam and CT head negative for intracranial process. Seizure unlikely given no hx of seizure, no seizure-like activity, and no loss of bowel/bladder control. Troponin x3 negative, EKG with no changes. Nuclear stress test is negative with 2D echo with EF 60-65% and grade 1 diastolic dysfunction.  -Continue telemetry  -Cardiology consulted, recommend outpatient follow up as needed given negative cardiac work up per above  Carotid artery disease: Carotid Doppler  with Right: 1-39% ICA stenosis. Left: 80%-99% ICA stenosis. Bilateral: Vertebral artery flow is antegrade.  -Vascular Surgery consulted, will see patient in the morning. Appreciate help.   Cervical spine pain: CT cervical spine negative for fractures or subluxation. Neurosurgery called but will not see patient at this time as no need for formal consult. lexion/extension cervical spine x-ray negative for soft tissue injury/fracture.  -Discontinue immobilizing c-collar -Place soft c-collar for comfort only with follow up with her PCP -Tylenol PRN for  pain   Cardiomegaly: Nuclear stress test and 2D echo unremarkable as discussed above.  -Continue home ASA 81mg , statin   HTN: 180/67 on admission, 161/75 currently (as as high as 209/66 during her nuclear stress test). Patient is orthostatic. Continue to monitor.  -Resume Coreg but reduced dose of 3.125 per Cardiology -Continue Irbesartan 300mg  PO daily, HCTZ 25mg  daily   PVD: She reports having stents. [facility]. INR 1.5 on presentation, subtherapeutic  -Warfarin per pharmacy   FEN/GI:  Heart healthy Protonix 40mg  daily   Dispo: Disposition is deferred at this time, awaiting improvement of current medical problems. Anticipated discharge in approximately 1-2 day(s).   The patient does have a current PCP Darryl Lent(Amanda Taylor, PA-C) and does not [facility] appointment after discharge.   The patient does have transportation limitations that hinder transportation to clinic appointments.  .Services Needed at time of discharge: Y = Yes, Blank = No PT:   OT:   RN:   Equipment:   Other:     LOS: 2 days   Ky BarbanSolianny D Lahari Suttles, MD 06/18/2014, 8:06 PM

## 2014-06-18 NOTE — Care Management Note (Signed)
    Page 1 of 1   06/18/2014     5:03:34 PM CARE MANAGEMENT NOTE 06/18/2014  Patient:  Ana Chapman,Ana Chapman   Account Number:  [id number]  Date Initiated:  06/18/2014  Documentation initiated by:  Letha CapeAYLOR,Tashawna Thom  Subjective/Objective Assessment:   dx fall  admit- lives alone     Action/Plan:   pt eval- no pt f/u   Anticipated DC Date:  06/19/2014   Anticipated DC Plan:  HOME/SELF CARE      DC Planning Services  CM consult      Choice offered to / List presented to:             Status of service:  In process, will continue to follow Medicare Important Message given?   (If response is "NO", the following Medicare IM given date fields will be blank) Date Medicare IM given:   Medicare IM given by:   Date Additional Medicare IM given:   Additional Medicare IM given by:    Discharge Disposition:    Per UR Regulation:  Reviewed for med. necessity/level of care/duration of stay  If discussed at Long Length of Stay Meetings, dates discussed:    Comments:

## 2014-06-18 NOTE — Progress Notes (Signed)
Ref: TAYLOR, AMANDA, PA-C   Subjective:  Feeling better. Passed nuclear stress test. Normal LV systolic function on echocardiogram. Afebrile. Now on soft C collar.  Objective:  Vital Signs in the last 24 hours: Temp:  [97.5 F (36.4 C)-98.1 F (36.7 C)] 98 F (36.7 C) (08/06 2017) Pulse Rate:  [57-100] 64 (08/06 2017) Cardiac Rhythm:  [-] Normal sinus rhythm (08/06 0800) Resp:  [14-18] 14 (08/06 2017) BP: (126-209)/(35-75) 126/35 mmHg (08/06 2017) SpO2:  [95 %-97 %] 95 % (08/06 2017) Weight:  [68.856 kg (151 lb 12.8 oz)] 68.856 kg (151 lb 12.8 oz) (08/06 0522)  Physical Exam: BP Readings from Last 1 Encounters:  06/18/14 126/35    Wt Readings from Last 1 Encounters:  06/18/14 68.856 kg (151 lb 12.8 oz)    Weight change: -2.841 kg (-6 lb 4.2 oz)  HEENT: [place]/AT, Eyes-Blue, PERL, EOMI, Conjunctiva-Pink, Sclera-Non-icteric Neck: No JVD, No bruit, Trachea midline. Lungs:  Clear, Bilateral. Cardiac:  Regular rhythm, normal S1 and S2, no S3. II/VI systolic murmur Abdomen:  Soft, non-tender. Extremities:  Trace edema present. No cyanosis. No clubbing. CNS: AxOx3, Cranial nerves grossly intact, moves all 4 extremities. Right handed. Skin: Warm and dry.   Intake/Output from previous day: 08/05 0701 - 08/06 0700 In: 422 [P.O.:422] Out: -     Lab Results: BMET    Component Value Date/Time   NA 142 06/16/2014 2356   NA 140 06/16/2014 1215   K 3.7 06/16/2014 2356   K 3.8 06/16/2014 1215   CL 104 06/16/2014 2356   CL 102 06/16/2014 1215   CO2 24 06/16/2014 2356   CO2 24 06/16/2014 1215   GLUCOSE 100* 06/16/2014 2356   GLUCOSE 103* 06/16/2014 1215   BUN 18 06/16/2014 2356   BUN 20 06/16/2014 1215   CREATININE 0.88 06/16/2014 2356   CREATININE 0.88 06/16/2014 1215   CALCIUM 9.0 06/16/2014 2356   CALCIUM 9.2 06/16/2014 1215   GFRNONAA 62* 06/16/2014 2356   GFRNONAA 62* 06/16/2014 1215   GFRAA 72* 06/16/2014 2356   GFRAA 72* 06/16/2014 1215   CBC    Component Value Date/Time   WBC 5.9 06/16/2014 1215   RBC  4.04 06/16/2014 1215   HGB 11.8* 06/16/2014 1215   HCT 35.5* 06/16/2014 1215   PLT 182 06/16/2014 1215   MCV 87.9 06/16/2014 1215   MCH 29.2 06/16/2014 1215   MCHC 33.2 06/16/2014 1215   RDW 14.9 06/16/2014 1215   LYMPHSABS 2.5 06/16/2014 1215   MONOABS 0.3 06/16/2014 1215   EOSABS 0.1 06/16/2014 1215   BASOSABS 0.0 06/16/2014 1215   HEPATIC Function Panel  Recent Labs  06/16/14 1215 06/16/14 2356  PROT 6.8 6.1   HEMOGLOBIN A1C No components found with this basename: HGA1C,  MPG   CARDIAC ENZYMES Lab Results  Component Value Date   TROPONINI <0.30 06/16/2014   TROPONINI <0.30 06/16/2014   BNP No results found for this basename: PROBNP,  in the last 8760 hours TSH  Recent Labs  06/16/14 1830  TSH 2.950   CHOLESTEROL No results found for this basename: CHOL,  in the last 8760 hours  Scheduled Meds: . aspirin EC  81 mg Oral Daily  . atorvastatin  80 mg Oral q1800  . calcium carbonate  1 tablet Oral Q breakfast  . carvedilol  3.125 mg Oral BID WC  . colesevelam  1,250 mg Oral Daily  . vitamin B-12  500 mcg Oral Daily  . ezetimibe  10 mg Oral Daily  . ferrous  sulfate  325 mg Oral Once per day on Mon Thu  . hydrochlorothiazide  25 mg Oral Daily  . irbesartan  300 mg Oral Daily  . omega-3 acid ethyl esters  2 g Oral BID  . pantoprazole  40 mg Oral Daily  . sodium chloride  3 mL Intravenous Q12H  . sodium chloride  3 mL Intravenous Q12H  . Warfarin - Pharmacist Dosing Inpatient   Does not apply q1800   Continuous Infusions:  PRN Meds:.sodium chloride, acetaminophen, acetaminophen, albuterol, sodium chloride  Assessment/Plan: Syncope  Fall  Chest pain  Thyroid Goiter  Hypertension  PVD  Chronic anemia  Continue lower dose of carvedilol. Must use cane or walker for ambulation. Decrease HCTZ by 50 %   LOS: 2 days    Orpah CobbAjay Philemon Riedesel  MD  06/18/2014, 8:43 PM

## 2014-06-18 NOTE — Progress Notes (Signed)
Orthopedic Tech Progress Note Patient Details:  Ana ChromanBetty Chapman 09/20/1935 [id number]  Ortho Devices Type of Ortho Device: Soft collar Ortho Device/Splint Location: neck Ortho Device/Splint Interventions: Ordered;Application   Jennye MoccasinHughes, Emori Kamau Craig 06/18/2014, 4:29 PM

## 2014-06-18 NOTE — Progress Notes (Signed)
PT Cancellation Note  Patient Details Name: Candie ChromanBetty Janota MRN: [id number] DOB: 06/02/1935   Cancelled Treatment:    Reason Eval/Treat Not Completed: Patient at procedure or test/unavailable Pt off floor for tests. Will follow up in PM if time allows or tomorrow on 8/7.   Alvie HeidelbergFolan, Ryder Chesmore A 06/18/2014, 9:40 AM Alvie HeidelbergShauna Folan, PT, DPT [phone]

## 2014-06-18 NOTE — Consult Note (Signed)
Consult Note  Patient name: Ana Chapman MRN: [id number] DOB: 03/26/1935 Sex: female  Consulting Physician:  Hospitalists  Reason for Consult:  Chief Complaint  Patient presents with  . Fall    HISTORY OF PRESENT ILLNESS: This a 79 yo female who presented on 8/4 to the ED after a fall, which was associated with LOC prior to the event.  There was no evidence of a seizure.  She does report occasional palpations.  CT head was negative.  She was seen by cardiology and underwent work-up including nuclear stress test.  She was started on a beta blocker.  Carotid doppler studies reveal 80-99% left ICA stenosis and 1-39% right ICA stenosis.  The patient is medically managed for hypertension with an ARB.  She is now on a statin for hypercholesterolemia.  Past Medical History  Diagnosis Date  . Coronary artery disease   . Hypertension   . Carotid stenosis     "left side 80% blocked"  . High cholesterol   . Heart murmur   . GERD (gastroesophageal reflux disease)   . Anemia   . History of blood transfusion     "related to RLE OR"   . Arthritis     "right knee, ankles; lower back" (06/16/2014)  . Urinary incontinence     Past Surgical History  Procedure Laterality Date  . Peripheral arterial stent graft Bilateral     "RLE was 100% blocked"  . Tubal ligation  ~ 1962    History   Social History  . Marital Status: Single    Spouse Name: N/A    Number of Children: N/A  . Years of Education: N/A   Occupational History  . Not on file.   Social History Main Topics  . Smoking status: Never Smoker   . Smokeless tobacco: Never Used  . Alcohol Use: No  . Drug Use: No  . Sexual Activity: No   Other Topics Concern  . Not on file   Social History Narrative  . No narrative on file    History reviewed. No pertinent family history.  Allergies as of 06/16/2014 - Review Complete 06/16/2014  Allergen Reaction Noted  . Contrast media [iodinated diagnostic agents] Itching,  Rash, and Other (See Comments) 06/16/2014  . Latex Itching and Rash 06/16/2014    No current facility-administered medications on file prior to encounter.   No current outpatient prescriptions on file prior to encounter.     REVIEW OF SYSTEMS: Please see HPI, No changes  PHYSICAL EXAMINATION: General: The patient appears their stated age.  Vital signs are BP 126/35  Pulse 64  Temp(Src) 98 F (36.7 C) (Oral)  Resp 14  Ht 4\' 8"  (1.422 m)  Wt 151 lb 12.8 oz (68.856 kg)  BMI 34.05 kg/m2  SpO2 95% Pulmonary: Respirations are non-labored HEENT:  No gross abnormalities Abdomen: Soft and non-tender  Musculoskeletal: There are no major deformities.   Neurologic: No focal weakness or paresthesias are detected, Skin: There are no ulcer or rashes noted. Psychiatric: The patient has normal affect. Cardiovascular: There is a regular rate and rhythm without significant murmur appreciated.  Diagnostic Studies: Carotid dopppler has been reviewed and reveal 80-99% L ICA stenosis    Assessment:  Asymptomatic Left ICA stenosis Plan: I do not think the patients syncopal episode is related to her left ICA stenosis.  [facility] Dr. Sondra Comeruz for her asymptomatic carotid stenosis.  I have recommended that  she continue to have problem with him.  I told her that I would recommend elective carotid endarterectomy vs. stenting but that since she has an est. vascular surgeon she should discuss this further with him.  Issue like to have subsequent care and [facility] I gave her my card to contact me.     Jorge Ny, M.D. [facility] Office: [phone] Pager:  [phone]

## 2014-06-18 NOTE — Progress Notes (Signed)
Physical Therapy Treatment Patient Details Name: Ana ChromanBetty Sopp MRN: [id number] DOB: 03/14/1935 Today's Date: 06/18/2014    History of Present Illness Patient is a 79 y/o female admitted s/p unwitnessed fall backwards hitting head on pavement on 8/4. CT head, CT spine (-). PMH positive for cardiomegaly, 80% left carotid stenosis. PVD s/p stent on Warfarin, CAD, HTN and anemia.    PT Comments    Patient tolerated more challenging balance activities today during ambulation without difficulty. Able to perform changes in gait speed, turns, stepping over objects and changes in direction without LOB. Pain noted in right rib area today with any movement. Pt functioning close to baseline and does not require follow up therapy at home. Will continue to follow in acute setting to maintain strength/mobility and progress as tolerated.    Follow Up Recommendations  No PT follow up;Supervision - Intermittent     Equipment Recommendations  None recommended by PT    Recommendations for Other Services       Precautions / Restrictions Precautions Precaution Comments: Aspen collar donned for comfort. Cervical Brace: For comfort Restrictions Weight Bearing Restrictions: No    Mobility  Bed Mobility Overal bed mobility: Needs Assistance Bed Mobility: Supine to Sit     Supine to sit: Modified independent (Device/Increase time)     General bed mobility comments: HOB elevated and use of rails for support.  Transfers Overall transfer level: Needs assistance Equipment used: None Transfers: Sit to/from [employer] CorporationStand;Stand Pivot Transfers Sit to Stand: Supervision Stand pivot transfers: Supervision       General transfer comment: Stood from EOB x3 without AD. Supervision for safety. Transferred to chair with supervision, no LOB noted.  Ambulation/Gait Ambulation/Gait assistance: Supervision Ambulation Distance (Feet): 250 Feet Assistive device: None Gait Pattern/deviations: Step-through  pattern;Decreased stride length;Drifts right/left     General Gait Details: Supervision for safety due to performing higher level balance challenges.    Stairs            Wheelchair Mobility    Modified Rankin (Stroke Patients Only)       Balance     Sitting balance-Leahy Scale: Good     Standing balance support: During functional activity Standing balance-Leahy Scale: Good Standing balance comment: Able to perform higher level balance challenges with dynamic standing without LOB without support.                    Cognition Arousal/Alertness: Awake/alert Behavior During Therapy: WFL for tasks assessed/performed Overall Cognitive Status: Within Functional Limits for tasks assessed                      Exercises      General Comments        Pertinent Vitals/Pain Pain Assessment: 0-10 Pain Score: 7  Pain Descriptors / Indicators: Sharp Pain Intervention(s): Repositioned    Home Living                      Prior Function            PT Goals (current goals can now be found in the care plan section) Progress towards PT goals: Progressing toward goals    Frequency  Min 3X/week    PT Plan Current plan remains appropriate    Co-evaluation             End of Session Equipment Utilized During Treatment: Gait belt Activity Tolerance: Patient tolerated treatment well Patient left: in chair;with chair alarm set;with call  bell/phone within reach     Time: 1432-1455 PT Time Calculation (min): 23 min  Charges:  $Gait Training: 8-22 mins $Neuromuscular Re-education: 8-22 mins                    G Codes:      Alvie Heidelberg A 07/10/2014, 3:02 PM Alvie Heidelberg, PT, DPT [phone]

## 2014-06-18 NOTE — Progress Notes (Signed)
  Date: 06/18/2014  Patient name: Ana Chapman  Medical record number: [id number]  Date of birth: 09/28/1935   This patient has been seen and the plan of care was discussed with the house staff. Please see their note for complete details. I concur with their findings with the following additions/corrections:  Will continue to do cardiac work up to see if it could have contributed to her syncopal event. She has not seen cardiology in 5 years per outpatient reports. Will try to have her get established with cardiology for discharge  Judyann Munsonynthia Arabella Revelle, MD 06/18/2014, 9:10 PM

## 2014-06-18 NOTE — Progress Notes (Signed)
Echo Lab  2D Echocardiogram completed.  Katharina CaperMelissa L Iden Stripling, RDCS 06/18/2014 12:31 PM

## 2014-06-19 DIAGNOSIS — E063 Autoimmune thyroiditis: Secondary | ICD-10-CM

## 2014-06-19 DIAGNOSIS — I6529 Occlusion and stenosis of unspecified carotid artery: Secondary | ICD-10-CM

## 2014-06-19 LAB — HIV ANTIBODY (ROUTINE TESTING W REFLEX): HIV 1&2 Ab, 4th Generation: NONREACTIVE

## 2014-06-19 LAB — PROTIME-INR
INR: 1.66 — ABNORMAL HIGH (ref 0.00–1.49)
Prothrombin Time: 19.6 seconds — ABNORMAL HIGH (ref 11.6–15.2)

## 2014-06-19 MED ORDER — HYDROCHLOROTHIAZIDE 12.5 MG PO CAPS
12.5000 mg | ORAL_CAPSULE | Freq: Every day | ORAL | Status: DC
  Administered 2014-06-19 – 2014-06-20 (×2): 12.5 mg via ORAL
  Filled 2014-06-19 (×2): qty 1

## 2014-06-19 MED ORDER — WARFARIN SODIUM 7.5 MG PO TABS
7.5000 mg | ORAL_TABLET | Freq: Once | ORAL | Status: AC
  Administered 2014-06-19: 7.5 mg via ORAL
  Filled 2014-06-19 (×2): qty 1

## 2014-06-19 NOTE — Evaluation (Signed)
Occupational Therapy Evaluation Patient Details Name: Jestine Bicknell MRN: [id number] DOB: Aug 21, 1935 Today's Date: 06/19/2014    History of Present Illness 79yo female admitted s/p fall hitting head backwards. CT (-) PMH: positive for cardiomegaly, 80% left carotid stenosis. PVD s/p stent on Warfarin, CAD, HTN and anemia.    Clinical Impression   Patient evaluated by Occupational Therapy with no further acute OT needs identified. All education has been completed and the patient has no further questions. See below for any follow-up Occupational Therapy or equipment needs. OT to sign off. Thank you for referral.      Follow Up Recommendations  No OT follow up    Equipment Recommendations  None recommended by OT    Recommendations for Other Services       Precautions / Restrictions Precautions Precaution Comments: Aspen collar donned for comfort. Required Braces or Orthoses: Cervical Brace Cervical Brace: For comfort;Soft collar      Mobility Bed Mobility               General bed mobility comments: in chair on arrival  Transfers Overall transfer level: Modified independent                    Balance           Standing balance support: During functional activity;No upper extremity supported Standing balance-Leahy Scale: Good                              ADL Overall ADL's : Needs assistance/impaired Eating/Feeding: Independent   Grooming: Wash/dry hands;Modified independent   Upper Body Bathing: Modified independent   Lower Body Bathing: Supervison/ safety;Sit to/from stand           Toilet Transfer: Modified Independent       Tub/ Shower Transfer: Supervision/safety;Shower seat;Grab Investment banker, operational Details (indicate cue type and reason): Pt with decr clearance of Rt LE. Advised pt not to shower initially and sponge bath. Pt should have (A) to practice transfer with family or friend first. Pt verbalized "I plan to take a  bath at the sink" Functional mobility during ADLs: Modified independent General ADL Comments: Pt is at adequate level for d/c home at this time. Pt advised not to transfer into tub alone for first few attempts     Vision                     Perception     Praxis      Pertinent Vitals/Pain Pain Assessment: No/denies pain     Hand Dominance Right   Extremity/Trunk Assessment Upper Extremity Assessment Upper Extremity Assessment: Overall WFL for tasks assessed   Lower Extremity Assessment Lower Extremity Assessment: Overall WFL for tasks assessed   Cervical / Trunk Assessment Cervical / Trunk Assessment: Normal (wearing soft collar backwards and insist on this positioning)   Communication Communication Communication: No difficulties   Cognition Arousal/Alertness: Awake/alert Behavior During Therapy: WFL for tasks assessed/performed Overall Cognitive Status: Within Functional Limits for tasks assessed                     General Comments       Exercises       Shoulder Instructions      Home Living Family/patient expects to be discharged to:: Private residence Living Arrangements: Alone Available Help at Discharge: Friend(s);Available PRN/intermittently Type of Home: Apartment Home Access: Elevator;Ramped entrance     Home  Layout: One level     Bathroom Shower/Tub: Chief Strategy OfficerTub/shower unit   Bathroom Toilet: Standard     Home Equipment: Environmental consultantWalker - 2 wheels;Cane - single point;Wheelchair - manual;Shower seat          Prior Functioning/Environment Level of Independence: Independent        Comments: Very active, rides bus to get everywhere, does all ADLs and IADLs. No recent falls reported.    OT Diagnosis:     OT Problem List:     OT Treatment/Interventions:      OT Goals(Current goals can be found in the care plan section) Acute Rehab OT Goals Patient Stated Goal: to go home today  OT Frequency:     Barriers to D/C:             Co-evaluation              End of Session Nurse Communication: Mobility status;Precautions  Activity Tolerance: Patient tolerated treatment well Patient left: in chair;with call bell/phone within reach;with chair alarm set   Time: [id number] OT Time Calculation (min): 11 min Charges:  OT General Charges $OT Visit: 1 Procedure OT Evaluation $Initial OT Evaluation Tier I: 1 Procedure OT Treatments $Self Care/Home Management : 8-22 mins G-Codes:    Harolyn RutherfordJones, Hadley Detloff B 06/19/2014, 8:56 AM Pager: [phone]

## 2014-06-19 NOTE — Progress Notes (Signed)
Subjective: No acute events overnight.   Patient is wearing a soft collar and states it is more comfortable and easier to adjust than the immobile collar. She says she feels steady on her feet and can walk to the door and back without issue (weakness) with PT.   Objective: Vital signs in last 24 hours: Filed Vitals:   06/18/14 2017 06/19/14 0532 06/19/14 1033 06/19/14 1224  BP: 126/35 136/56 126/52   Pulse: 64 61 70   Temp: 98 F (36.7 C) 97.8 F (36.6 C)    TempSrc: Oral Oral    Resp: 14 15    Height:      Weight:  70.444 kg (155 lb 4.8 oz)    SpO2: 95% 95%  96%   Weight change: 1.588 kg (3 lb 8 oz) No intake or output data in the 24 hours ending 06/19/14 1728  Constitutional: Sitting in a chair with a soft c-spine collar in place.  HENT:  Head: Normocephalic, atraumatic, no tenderness to light palpation along C3, C4, C5.  Eyes: Conjunctivae are normal. Pupils are equal, round, and reactive to light, EOMi Cardiovascular: Regular rate and rhythm. No audible murmur. Bilateral carotid bruits audible on auscultation Pulmonary/Chest: Normal respiratory effort, CTAB with no wheezes or crackles.  Abdominal: Soft. Bowel sounds present, no abdominal distension or tenderness.   Neurological: She is alert and oriented to person, place, and time. CN II-XII grossly intact. Coordination normal.  Ext: 5/5 strength, no LE edema  Lab Results: Basic Metabolic Panel:  Recent Labs Lab 06/16/14 1215 06/16/14 2356  NA 140 142  K 3.8 3.7  CL 102 104  CO2 24 24  GLUCOSE 103* 100*  BUN 20 18  CREATININE 0.88 0.88  CALCIUM 9.2 9.0   Liver Function Tests:  Recent Labs Lab 06/16/14 1215 06/16/14 2356  AST 28 20  ALT 21 14  ALKPHOS 143* 120*  BILITOT 0.4 0.5  PROT 6.8 6.1  ALBUMIN 3.8 3.3*   No results found for this basename: LIPASE, AMYLASE,  in the last 168 hours No results found for this basename: AMMONIA,  in the last 168 hours  CBC:  Recent Labs Lab 06/16/14 1215    WBC 5.9  NEUTROABS 3.0  HGB 11.8*  HCT 35.5*  MCV 87.9  PLT 182   Cardiac Enzymes:  Recent Labs Lab 06/16/14 1815 06/16/14 2356  TROPONINI <0.30 <0.30   BNP: No results found for this basename: PROBNP,  in the last 168 hours D-Dimer: No results found for this basename: DDIMER,  in the last 168 hours CBG:  Recent Labs Lab 06/16/14 1244  GLUCAP 96   Hemoglobin A1C: No results found for this basename: HGBA1C,  in the last 168 hours Fasting Lipid Panel: No results found for this basename: CHOL, HDL, LDLCALC, TRIG, CHOLHDL, LDLDIRECT,  in the last 168 hours Thyroid Function Tests:  Recent Labs Lab 06/16/14 1830  TSH 2.950   Coagulation:  Recent Labs Lab 06/16/14 1215 06/17/14 0940 06/18/14 0742 06/19/14 0539  LABPROT 18.1* 16.2* 20.0* 19.6*  INR 1.50* 1.30 1.70* 1.66*   Anemia Panel: No results found for this basename: VITAMINB12, FOLATE, FERRITIN, TIBC, IRON, RETICCTPCT,  in the last 168 hours Urine Drug Screen: Drugs of Abuse  No results found for this basename: labopia,  cocainscrnur,  labbenz,  amphetmu,  thcu,  labbarb    Alcohol Level: No results found for this basename: ETH,  in the last 168 hours Urinalysis: No results found for this basename: COLORURINE, APPERANCEUR, LABSPEC,  PHURINE, GLUCOSEU, HGBUR, BILIRUBINUR, KETONESUR, PROTEINUR, UROBILINOGEN, NITRITE, LEUKOCYTESUR,  in the last 168 hours Misc. Labs:   Micro Results: No results found for this or any previous visit (from the past 240 hour(s)). Studies/Results: Nm Myocar Multi W/spect W/wall Motion / Ef  06/18/2014   CLINICAL DATA:  Chest pain.  Syncope.  Palpitations.  EXAM: MYOCARDIAL IMAGING WITH SPECT (REST AND PHARMACOLOGIC-STRESS)  GATED LEFT VENTRICULAR WALL MOTION STUDY  LEFT VENTRICULAR EJECTION FRACTION  TECHNIQUE: Standard myocardial SPECT imaging was performed after resting intravenous injection of 10 mCi Tc-76m sestamibi. Subsequently, intravenous infusion of Lexiscan was  performed under the supervision of the Cardiology staff. At peak effect of the drug, 30 mCi Tc-78m sestamibi was injected intravenously and standard myocardial SPECT imaging was performed. Quantitative gated imaging was also performed to evaluate left ventricular wall motion, and estimate left ventricular ejection fraction.  COMPARISON:  Chest radiograph 06/16/2014.  FINDINGS: Normal myocardial perfusion on both breast and stress phases. Ejection fraction was 88%. End-diastolic volume is 38 mm. End systolic volume is 5 mL. Wall motion and thickening is normal.  IMPRESSION: 1. No reversible ischemia. 2. Calculated ejection fraction 88%. Ejection fraction is likely overestimated by study.   Electronically Signed   By: Andreas Newport M.D.   On: 06/18/2014 12:59   Dg Cerv Spine Flex&ext Only  06/17/2014   CLINICAL DATA:  Whiplash.  EXAM: CERVICAL SPINE - FLEXION AND EXTENSION VIEWS ONLY  COMPARISON:  CT scan dated 06/16/2014  FINDINGS: There is no fracture or cyst abnormal subluxation with flexion or extension. There is slight degenerative narrowing of the C5-6 disc space. No prevertebral soft tissue swelling.  Diffuse osteopenia.  IMPRESSION: No acute abnormalities.  No abnormal subluxation.   Electronically Signed   By: Geanie Cooley M.D.   On: 06/17/2014 18:50   Medications: I have reviewed the patient's current medications. Scheduled Meds: . aspirin EC  81 mg Oral Daily  . atorvastatin  80 mg Oral q1800  . calcium carbonate  1 tablet Oral Q breakfast  . carvedilol  3.125 mg Oral BID WC  . colesevelam  1,250 mg Oral Daily  . vitamin B-12  500 mcg Oral Daily  . ezetimibe  10 mg Oral Daily  . ferrous sulfate  325 mg Oral Once per day on Mon Thu  . hydrochlorothiazide  12.5 mg Oral Daily  . irbesartan  300 mg Oral Daily  . omega-3 acid ethyl esters  2 g Oral BID  . pantoprazole  40 mg Oral Daily  . sodium chloride  3 mL Intravenous Q12H  . sodium chloride  3 mL Intravenous Q12H  . warfarin  7.5 mg  Oral ONCE-1800  . Warfarin - Pharmacist Dosing Inpatient   Does not apply q1800   Continuous Infusions:  PRN Meds:.sodium chloride, acetaminophen, acetaminophen, albuterol, sodium chloride Assessment/Plan: Principal Problem:   Syncope Active Problems:   CAD (coronary artery disease)   PAD (peripheral artery disease)   HTN (hypertension)   Carotid artery disease   GERD (gastroesophageal reflux disease)   Anemia   Enlarged thyroid gland   Hashimoto's thyroiditis  A: Ms. Domanski is a 79 yo woman with cardiomegaly, 80-99% left carotid artery stenosis, and PVD admitted after a syncopal event.   Syncope: Etiology unclear. Cardiac etiology possible, although cardiac workup thus far has been negative. Autonomic dysfunction or vasovagal syncope also possible. She has no neurologic deficits on physical exam and CT head was negative for intracranial process. Troponin x3 negative, EKG with no changes. Nuclear  stress test was negative with 2D echo with EF 60-65% and grade 1 diastolic dysfunction. Cardiology has recommended for patient to use cane or walker, which patient has at home. -Continue telemetry   Carotid artery disease: Carotid Dopplers showed 1-39% right ICA stenosis and 80%-99% left ICA stenosis. Bilateral vertebral artery flow is antegrade. Vascular Surgery was consulted, and they do not think the patients syncopal episode is related to her left ICA stenosis. Recommended elective carotid endarterectomy vs. stenting but to follow up with her est. vascular surgeon, Dr. Sondra Comeruz, and further discuss this with him.  Cervical spine pain: CT cervical spine negative for fractures or subluxation. Flexion/extension cervical spine x-ray negative for soft tissue injury/fracture. Patient has been switched from immobilizing c-spine to soft collar.  -Soft c-collar is for comfort only, will follow up with her PCP  -Tylenol PRN for pain   Cardiomegaly with grade 1 diastolic dysfunction: Nuclear stress test  and 2D echo unremarkable as discussed above.  -Continue home ASA 81mg , statin, and carvedilol  HTN: Resolved. 180/67 on admission, now 126/52. Per cardiology's recommendations, carvedilol and HCTZ doses have been decreased. Will monitor closely.  -Carvedilol 3.125 BID  -Reduced HCTZ from 25mg  to 12.5mg  daily  -Continue Irbesartan 300mg  PO daily   Hashimoto's Thyroiditis: CT cervical spine showed diffusely enlarged thyroid gland with multiple bilateral indeterminate thyroid nodules. TSH level was normal on 8/4 at 2.950. TPO antibody level was high at 2774. Patient has reported palpitations, chest pain, and SOB in the past.  -[facility] [place] follow-up F[place] and/or Thyroid u/s.  PVD: She reports having lower extremity stents. She is on Warfarin. INR was 1.5 on presentation, and INR continues to be subtherapeutic. INR 1.66 today.  -Warfarin per pharmacy   FEN/GI:  Heart healthy  Protonix 40mg  daily   Dispo:  Anticipated discharge is tomorrow.  This is a Psychologist, occupationalMedical Student Note.  The care of the patient was discussed with Dr. Virgina OrganQureshi and the assessment and plan formulated with their assistance.  Please see their attached note for official documentation of the daily encounter.   LOS: 3 days   Lillia CarmelNida Luvenia Cranford, Med Student 06/19/2014, 5:28 PM

## 2014-06-19 NOTE — Progress Notes (Signed)
Subjective: No acute events overnight.   Patient is wearing a soft collar and states it is more comfortable and easier to adjust than the immobile collar. She says she feels steady on her feet and can walk to the door and back without issue (weakness) with PT.   Objective: Vital signs in last 24 hours: Filed Vitals:   06/18/14 1314 06/18/14 2017 06/19/14 0532 06/19/14 1033  BP: 161/75 126/35 136/56 126/52  Pulse: 66 64 61 70  Temp: 97.5 F (36.4 C) 98 F (36.7 C) 97.8 F (36.6 C)   TempSrc: Oral Oral Oral   Resp: 18 14 15    Height:      Weight:   70.444 kg (155 lb 4.8 oz)   SpO2: 97% 95% 95%    Weight change: 1.588 kg (3 lb 8 oz) No intake or output data in the 24 hours ending 06/19/14 1200  Constitutional: Sitting in a chair with a soft c-spine collar in place.  HENT:  Head: Normocephalic, atraumatic, no tenderness to light palpation along C3, C4, C5.  Eyes: Conjunctivae are normal. Pupils are equal, round, and reactive to light, EOMi Cardiovascular: Regular rate and rhythm. No audible murmur. Bilateral carotid bruits audible on auscultation Pulmonary/Chest: Normal respiratory effort, CTAB with no wheezes or crackles.  Abdominal: Soft. Bowel sounds present, no abdominal distension or tenderness.   Neurological: She is alert and oriented to person, place, and time. CN II-XII grossly intact. Coordination normal.  Ext: 5/5 strength, no LE edema  Lab Results: Basic Metabolic Panel:  Recent Labs Lab 06/16/14 1215 06/16/14 2356  NA 140 142  K 3.8 3.7  CL 102 104  CO2 24 24  GLUCOSE 103* 100*  BUN 20 18  CREATININE 0.88 0.88  CALCIUM 9.2 9.0   Liver Function Tests:  Recent Labs Lab 06/16/14 1215 06/16/14 2356  AST 28 20  ALT 21 14  ALKPHOS 143* 120*  BILITOT 0.4 0.5  PROT 6.8 6.1  ALBUMIN 3.8 3.3*   No results found for this basename: LIPASE, AMYLASE,  in the last 168 hours No results found for this basename: AMMONIA,  in the last 168  hours  CBC:  Recent Labs Lab 06/16/14 1215  WBC 5.9  NEUTROABS 3.0  HGB 11.8*  HCT 35.5*  MCV 87.9  PLT 182   Cardiac Enzymes:  Recent Labs Lab 06/16/14 1815 06/16/14 2356  TROPONINI <0.30 <0.30   BNP: No results found for this basename: PROBNP,  in the last 168 hours D-Dimer: No results found for this basename: DDIMER,  in the last 168 hours CBG:  Recent Labs Lab 06/16/14 1244  GLUCAP 96   Hemoglobin A1C: No results found for this basename: HGBA1C,  in the last 168 hours Fasting Lipid Panel: No results found for this basename: CHOL, HDL, LDLCALC, TRIG, CHOLHDL, LDLDIRECT,  in the last 168 hours Thyroid Function Tests:  Recent Labs Lab 06/16/14 1830  TSH 2.950   Coagulation:  Recent Labs Lab 06/16/14 1215 06/17/14 0940 06/18/14 0742 06/19/14 0539  LABPROT 18.1* 16.2* 20.0* 19.6*  INR 1.50* 1.30 1.70* 1.66*   Anemia Panel: No results found for this basename: VITAMINB12, FOLATE, FERRITIN, TIBC, IRON, RETICCTPCT,  in the last 168 hours Urine Drug Screen: Drugs of Abuse  No results found for this basename: labopia, cocainscrnur, labbenz, amphetmu, thcu, labbarb    Alcohol Level: No results found for this basename: ETH,  in the last 168 hours Urinalysis: No results found for this basename: COLORURINE, APPERANCEUR, LABSPEC, PHURINE, GLUCOSEU, HGBUR,  BILIRUBINUR, KETONESUR, PROTEINUR, UROBILINOGEN, NITRITE, LEUKOCYTESUR,  in the last 168 hours Misc. Labs:   Micro Results: No results found for this or any previous visit (from the past 240 hour(s)). Studies/Results: Nm Myocar Multi W/spect W/wall Motion / Ef  06/18/2014   CLINICAL DATA:  Chest pain.  Syncope.  Palpitations.  EXAM: MYOCARDIAL IMAGING WITH SPECT (REST AND PHARMACOLOGIC-STRESS)  GATED LEFT VENTRICULAR WALL MOTION STUDY  LEFT VENTRICULAR EJECTION FRACTION  TECHNIQUE: Standard myocardial SPECT imaging was performed after resting intravenous injection of 10 mCi Tc-83m sestamibi. Subsequently,  intravenous infusion of Lexiscan was performed under the supervision of the Cardiology staff. At peak effect of the drug, 30 mCi Tc-31m sestamibi was injected intravenously and standard myocardial SPECT imaging was performed. Quantitative gated imaging was also performed to evaluate left ventricular wall motion, and estimate left ventricular ejection fraction.  COMPARISON:  Chest radiograph 06/16/2014.  FINDINGS: Normal myocardial perfusion on both breast and stress phases. Ejection fraction was 88%. End-diastolic volume is 38 mm. End systolic volume is 5 mL. Wall motion and thickening is normal.  IMPRESSION: 1. No reversible ischemia. 2. Calculated ejection fraction 88%. Ejection fraction is likely overestimated by study.   Electronically Signed   By: Andreas Newport M.D.   On: 06/18/2014 12:59   Dg Cerv Spine Flex&ext Only  06/17/2014   CLINICAL DATA:  Whiplash.  EXAM: CERVICAL SPINE - FLEXION AND EXTENSION VIEWS ONLY  COMPARISON:  CT scan dated 06/16/2014  FINDINGS: There is no fracture or cyst abnormal subluxation with flexion or extension. There is slight degenerative narrowing of the C5-6 disc space. No prevertebral soft tissue swelling.  Diffuse osteopenia.  IMPRESSION: No acute abnormalities.  No abnormal subluxation.   Electronically Signed   By: Geanie Cooley M.D.   On: 06/17/2014 18:50   Medications: I have reviewed the patient's current medications. Scheduled Meds: . aspirin EC  81 mg Oral Daily  . atorvastatin  80 mg Oral q1800  . calcium carbonate  1 tablet Oral Q breakfast  . carvedilol  3.125 mg Oral BID WC  . colesevelam  1,250 mg Oral Daily  . vitamin B-12  500 mcg Oral Daily  . ezetimibe  10 mg Oral Daily  . ferrous sulfate  325 mg Oral Once per day on Mon Thu  . hydrochlorothiazide  12.5 mg Oral Daily  . irbesartan  300 mg Oral Daily  . omega-3 acid ethyl esters  2 g Oral BID  . pantoprazole  40 mg Oral Daily  . sodium chloride  3 mL Intravenous Q12H  . sodium chloride  3 mL  Intravenous Q12H  . warfarin  7.5 mg Oral ONCE-1800  . Warfarin - Pharmacist Dosing Inpatient   Does not apply q1800   Continuous Infusions:  PRN Meds:.sodium chloride, acetaminophen, acetaminophen, albuterol, sodium chloride Assessment/Plan: Principal Problem:   Syncope Active Problems:   CAD (coronary artery disease)   PAD (peripheral artery disease)   HTN (hypertension)   Carotid artery disease   Other and unspecified hyperlipidemia   GERD (gastroesophageal reflux disease)   Anemia   Enlarged thyroid gland  A: Ana Chapman is a 79 yo woman with cardiomegaly, 80-99% left carotid artery stenosis, and PVD admitted after a syncopal event.   Syncope: Etiology unclear. Differential includes cardiac arrhythmia (less likely as there has been no evidence of this on telemetry), medication-induced syncope, hypoglycemic event (2/2 patient missing breakfast), worsening carotid artery disease (80-99% stenosis in left carotid artery), or autonomic dysfunction. She has no neurologic deficits on  physical exam and CT head was negative for intracranial process. Seizure is unlikely given no hx of seizure, no seizure-like activity, and no loss of bowel/bladder control during event. Troponin x3 negative, EKG with no changes. Nuclear stress test is negative with 2D echo with EF 60-65% and grade 1 diastolic dysfunction.  -Continue telemetry  -Cardiology consulted, recommend outpatient follow up as needed given negative cardiac work up per above   Carotid artery disease: Carotid Doppler with Right: 1-39% ICA stenosis. Left: 80%-99% ICA stenosis. Bilateral Vertebral artery flow is antegrade. Vascular Surgery consulted, and they do not think the patients syncopal episode is related to her left ICA stenosis. Recommended elective carotid endarterectomy vs. stenting but to follow up with her est. vascular surgeon, Dr. Sondra Comeruz, and further discuss this with him.  Cervical spine pain: CT cervical spine negative for  fractures or subluxation. Flexion/extension cervical spine x-ray negative for soft tissue injury/fracture. Patient has been switched from immobilizing c-spine to soft collar.  -Soft c-collar is for comfort only, will follow up with her PCP  -Tylenol PRN for pain   Cardiomegaly: Nuclear stress test and 2D echo unremarkable as discussed above.  -Continue home ASA 81mg , statin   HTN: 180/67 on admission, now 126/52. Per cardiology's recommendations, carvedilol and HCTZ doses have been decreased. Will monitor closely.  -Carvedilol 3.125 BID per Cardiology  -Reduced HCTZ from 25mg  to 12.5mg  daily per Cardiology -Continue Irbesartan 300mg  PO daily   Hashimoto's Thyroiditis: CT cervical spine showed diffusely enlarged thyroid gland with multiple bilateral indeterminate thyroid nodules. TSH level was normal on 8/4 at 2.950. TPO antibody level was high at 2774. Patient has reported palpitations, chest pain, and SOB in the past.  -[facility] [place] follow-up FNA and/or Thyroid u/s.  PVD: She reports having stents. [facility]. INR 1.5 on presentation, and INR continues to be subtherapeutic. INR 1.7 today.  -Warfarin per pharmacy  -Consider enoxaparin bridge at discharge  FEN/GI:  Heart healthy  Protonix 40mg  daily   Dispo: Disposition is deferred at this time, awaiting improvement of current medical problems. Anticipated discharge in approximately 1-2 day(s).   This is a Psychologist, occupationalMedical Student Note.  The care of the patient was discussed with Dr. Drue SecondSnider and the assessment and plan formulated with their assistance.  Please see their attached note for official documentation of the daily encounter.   LOS: 3 days   Lillia CarmelNida Stasia Somero, Med Student 06/19/2014, 12:00 PM

## 2014-06-19 NOTE — Progress Notes (Signed)
CARE MANAGEMENT NOTE 06/19/2014  Patient:  Ana Chapman,Ana Chapman   Account Number:  [id number]  Date Initiated:  06/18/2014  Documentation initiated by:  Letha CapeAYLOR,DEBORAH  Subjective/Objective Assessment:   dx fall  admit- lives alone     Action/Plan:   pt eval- no pt f/u   Anticipated DC Date:  06/19/2014   Anticipated DC Plan:  HOME/SELF CARE      DC Planning Services  CM consult      Choice offered to / List presented to:             Status of service:  Completed, signed off Medicare Important Message given?  YES (If response is "NO", the following Medicare IM given date fields will be blank) Date Medicare IM given:  06/19/2014 Medicare IM given by:  Letha CapeAYLOR,DEBORAH Date Additional Medicare IM given:   Additional Medicare IM given by:    Discharge Disposition:  HOME/SELF CARE  Per UR Regulation:  Reviewed for med. necessity/level of care/duration of stay  If discussed at Long Length of Stay Meetings, dates discussed:    Comments:  06/19/2014 1135 No NCM needs identified. Isidoro DonningAlesia Tomi Paddock  RN CCM Case Mgmt phone [phone]

## 2014-06-19 NOTE — Progress Notes (Addendum)
Pt has a low bp of 93/57. Pt is asymptomatic. Paged provider on call. Awaiting further orders. No further orders given except to continue to monitor pt.

## 2014-06-19 NOTE — Progress Notes (Signed)
  Date: 06/19/2014  Patient name: Ana Chapman  Medical record number: [id number]  Date of birth: 01/13/1935   This patient has been seen and the plan of care was discussed with the house staff. Please see their note for complete details. I concur with their findings with the following additions/corrections:  Patient is doing well, no further syncopal episodes. Cardiology recommendation to do further dose reduction of carvedilol and diuretcs which we will do and explain to the patient she will need follow up with her pcp. She is to be evaluate by vascular surgery today.  Judyann Munsonynthia Grainne Knights, MD 06/19/2014, 10:23 PM

## 2014-06-19 NOTE — Progress Notes (Signed)
  I have seen and examined the patient, and reviewed the daily progress note by Lillia CarmelNida Waheed, MS 4 and discussed the care of the patient with them. Please see my progress note from 06/19/2014 for further details regarding assessment and plan.    Signed:  Baltazar ApoSamaya J Lyniah Fujita, MD 06/19/2014, 5:42 PM

## 2014-06-19 NOTE — Progress Notes (Signed)
     Patient is alert and oriented times 3.  She has asymptomatic left carotid stenosis.  Right: 1-39% ICA stenosis. Left: 80%-99% ICA stenosis. Bilateral: Vertebral artery flow is antegrade.  [place] [facility] [place] [facility] and has been followed every six months for carotid duplexes.  He physical exam is normal with palpable radial, brachial, femoral, and DP pulses 2+ bilaterally.  We don't think the syncopal episode is related to her left ICA stenosis.  A full consult note by Dr. Myra GianottiBrabham will follow.  She may be discharged home today from a vascular stand point.  Frederick Klinger MAUREEN PA-C

## 2014-06-19 NOTE — Progress Notes (Signed)
ANTICOAGULATION CONSULT NOTE - Follow Up Consult  Pharmacy Consult for coumadin Indication: hx PVD  Allergies  Allergen Reactions  . Contrast Media [Iodinated Diagnostic Agents] Itching, Rash and Other (See Comments)    Skin was peeling   . Latex Itching and Rash    Patient Measurements: Height: 4\' 8"  (142.2 cm) Weight: 155 lb 4.8 oz (70.444 kg) IBW/kg (Calculated) : 36.3   Vital Signs: Temp: 97.8 F (36.6 C) (08/07 0532) Temp src: Oral (08/07 0532) BP: 126/52 mmHg (08/07 1033) Pulse Rate: 70 (08/07 1033)  Labs:  Recent Labs  06/16/14 1215 06/16/14 1815 06/16/14 2356 06/17/14 0940 06/18/14 0742 06/19/14 0539  HGB 11.8*  --   --   --   --   --   HCT 35.5*  --   --   --   --   --   PLT 182  --   --   --   --   --   APTT 32  --   --   --   --   --   LABPROT 18.1*  --   --  16.2* 20.0* 19.6*  INR 1.50*  --   --  1.30 1.70* 1.66*  CREATININE 0.88  --  0.88  --   --   --   TROPONINI  --  <0.30 <0.30  --   --   --     Estimated Creatinine Clearance: 42.2 ml/min (by C-G formula based on Cr of 0.88).  Assessment: Patient is a 79 y.o F on coumadin for hx PVD.  INR remains sub-therapeutic at 1.66.  No bleeding documented.  Goal of Therapy:  INR 2-3    Plan:  1) Repeat Coumadin 7.5mg  (1.5x home dose) today    Cerria Randhawa P 06/19/2014,11:14 AM

## 2014-06-19 NOTE — Progress Notes (Addendum)
Subjective: Ana Chapman was seen and examined at bedside. She feels much better today but still soreness in her neck. She wishes to go home and reports being able to take care of herself at home.   Objective: Vital signs in last 24 hours: Filed Vitals:   06/18/14 1041 06/18/14 1314 06/18/14 2017 06/19/14 0532  BP: 209/66 161/75 126/35 136/56  Pulse: 82 66 64 61  Temp:  97.5 F (36.4 C) 98 F (36.7 C) 97.8 F (36.6 C)  TempSrc:  Oral Oral Oral  Resp: 16 18 14 15   Height:      Weight:    155 lb 4.8 oz (70.444 kg)  SpO2:  97% 95% 95%   Weight change: 3 lb 8 oz (1.588 kg) No intake or output data in the 24 hours ending 06/19/14 1030 Vitals reviewed. General: sitting up in chair, NAD HEENT: EOMI, soft collar in place Cardiac: RRR Pulm: clear to auscultation bilaterally, no wheezes, rales, or rhonchi Abd: soft, BS present Ext: moving all 4 extremities Neuro: alert and oriented X3, strength and sensation grossly intact  Lab Results: Basic Metabolic Panel:  Recent Labs Lab 06/16/14 1215 06/16/14 2356  NA 140 142  K 3.8 3.7  CL 102 104  CO2 24 24  GLUCOSE 103* 100*  BUN 20 18  CREATININE 0.88 0.88  CALCIUM 9.2 9.0   Liver Function Tests:  Recent Labs Lab 06/16/14 1215 06/16/14 2356  AST 28 20  ALT 21 14  ALKPHOS 143* 120*  BILITOT 0.4 0.5  PROT 6.8 6.1  ALBUMIN 3.8 3.3*   CBC:  Recent Labs Lab 06/16/14 1215  WBC 5.9  NEUTROABS 3.0  HGB 11.8*  HCT 35.5*  MCV 87.9  PLT 182   Cardiac Enzymes:  Recent Labs Lab 06/16/14 1815 06/16/14 2356  TROPONINI <0.30 <0.30   CBG:  Recent Labs Lab 06/16/14 1244  GLUCAP 96   Thyroid Function Tests:  Recent Labs Lab 06/16/14 1830  TSH 2.950   Coagulation:  Recent Labs Lab 06/16/14 1215 06/17/14 0940 06/18/14 0742 06/19/14 0539  LABPROT 18.1* 16.2* 20.0* 19.6*  INR 1.50* 1.30 1.70* 1.66*   Studies/Results: Nm Myocar Multi W/spect W/wall Motion / Ef  06/18/2014   CLINICAL DATA:  Chest pain.   Syncope.  Palpitations.  EXAM: MYOCARDIAL IMAGING WITH SPECT (REST AND PHARMACOLOGIC-STRESS)  GATED LEFT VENTRICULAR WALL MOTION STUDY  LEFT VENTRICULAR EJECTION FRACTION  TECHNIQUE: Standard myocardial SPECT imaging was performed after resting intravenous injection of 10 mCi Tc-8471m sestamibi. Subsequently, intravenous infusion of Lexiscan was performed under the supervision of the Cardiology staff. At peak effect of the drug, 30 mCi Tc-2771m sestamibi was injected intravenously and standard myocardial SPECT imaging was performed. Quantitative gated imaging was also performed to evaluate left ventricular wall motion, and estimate left ventricular ejection fraction.  COMPARISON:  Chest radiograph 06/16/2014.  FINDINGS: Normal myocardial perfusion on both breast and stress phases. Ejection fraction was 88%. End-diastolic volume is 38 mm. End systolic volume is 5 mL. Wall motion and thickening is normal.  IMPRESSION: 1. No reversible ischemia. 2. Calculated ejection fraction 88%. Ejection fraction is likely overestimated by study.   Electronically Signed   By: Andreas NewportGeoffrey  Lamke M.D.   On: 06/18/2014 12:59   Dg Cerv Spine Flex&ext Only  06/17/2014   CLINICAL DATA:  Whiplash.  EXAM: CERVICAL SPINE - FLEXION AND EXTENSION VIEWS ONLY  COMPARISON:  CT scan dated 06/16/2014  FINDINGS: There is no fracture or cyst abnormal subluxation with flexion or extension. There  is slight degenerative narrowing of the C5-6 disc space. No prevertebral soft tissue swelling.  Diffuse osteopenia.  IMPRESSION: No acute abnormalities.  No abnormal subluxation.   Electronically Signed   By: Geanie Cooley M.D.   On: 06/17/2014 18:50   Medications: I have reviewed the patient's current medications. Scheduled Meds: . aspirin EC  81 mg Oral Daily  . atorvastatin  80 mg Oral q1800  . calcium carbonate  1 tablet Oral Q breakfast  . carvedilol  3.125 mg Oral BID WC  . colesevelam  1,250 mg Oral Daily  . vitamin B-12  500 mcg Oral Daily  .  ezetimibe  10 mg Oral Daily  . ferrous sulfate  325 mg Oral Once per day on Mon Thu  . hydrochlorothiazide  12.5 mg Oral Daily  . irbesartan  300 mg Oral Daily  . omega-3 acid ethyl esters  2 g Oral BID  . pantoprazole  40 mg Oral Daily  . sodium chloride  3 mL Intravenous Q12H  . sodium chloride  3 mL Intravenous Q12H  . Warfarin - Pharmacist Dosing Inpatient   Does not apply q1800   Continuous Infusions:  PRN Meds:.sodium chloride, acetaminophen, acetaminophen, albuterol, sodium chloride Assessment/Plan: Principal Problem:   Syncope Active Problems:   CAD (coronary artery disease)   PAD (peripheral artery disease)   HTN (hypertension)   Carotid artery disease   GERD (gastroesophageal reflux disease)   Anemia   Enlarged thyroid gland   Hashimoto's thyroiditis  Ana Chapman is a 79 year old female with cardiomegaly, carotid stenosis, and PVD admitted for syncope.   Syncope: Etiology unclear. Possibly cardiac although work up this far has been negative with no reversible ischemia on stress test, echo with 60-65% EF and grade 1 DD with abnormal LV relaxation.  She does have significant L carotid stenosis that Dr. Myra Gianotti has been nice enough to consult during admission. He does not feel that it is the cause of syncope and recommends for her to follow up with her outpatient vascular surgeon Dr. Sondra Come.  She is on low dose BB with HR in 70s today. BP also well controlled 126/52 on HCTZ 12.5mg .  -Likely d/c home today with outpatient pcp, cardiology, and vascular surgery follow up -Continue low dose BB and HCTZ for now -Walking with assistance, will try to arrange for cane or walker on discharge -Nort orthostatic on repeat vitals this morning   Carotid artery disease: Carotid Doppler with Right: 1-39% ICA stenosis. Left: 80%-99% ICA stenosis per vascular note. Bilateral: Vertebral artery flow is antegrade.  -as per above, outpatient follow up with Dr. Sondra Come  Cervical spine pain: CT cervical  spine was negative for fractures or subluxation along with no acute abnormalities on flexion and extension cervical sine xrays . Neurosurgery was apparently consulted this admission and no formal consult was thought to be needed.  -Soft c-collar in place for comfort only with follow up with her PCP advised -Tylenol PRN for pain   Cardiomegaly with grade 1 DD on echo: Nuclear stress test and 2D echo unremarkable as discussed above.  -Continue home ASA 81mg , statin, low dose BB  HTN: improved. BP 126/52 today. On low dose coreg, HCTZ, and ARB. Patient has been orthostatic.  -Repeated orthostatic vital signs that were negative -Continue coreg 3.125 and hctz 12.5mg  daily -Continue Irbesartan 300mg  PO daily  PVD: Reported stents in the past. On Warfarin. INR remains subtherapeutic today, 1.66 -Warfarin per pharmacy   Hashimoto's thyroiditis--symptomatic occasionally with palpitations, chest pain  and SOB, although currently with no complaints. TSH 2.950 but TPO Ab 2774, severely elevated. Therefore, she is recommended for ultrasound.  She did have a CT this admission that revealed diffuse enlarged thyroid gland with multiple b/l indeterminate thyroid nodules.  -consider thyroid ultrasound as outpatient and FNA biopsy if appropriate   Diet: HH DVT Ppx: Coumadin Dispo: ?D/C home today  The patient does have a current PCP Darryl Lent, PA-C) and does not need an [facility] hospital follow-up appointment after discharge.  The patient does not have transportation limitations that hinder transportation to clinic appointments.  Services Needed at time of discharge: Y = Yes, Blank = No PT: Intermittent supervision  OT:   RN:   Equipment: Cane or walker already at home per patient  Other:     LOS: 3 days   Baltazar Apo, MD 06/19/2014, 10:30 AM

## 2014-06-19 NOTE — Clinical Social Work Note (Signed)
Bus passes provided for patient's transport.   Roddie McBryant Nayleah Gamel MSW, [place], [place], [phone]

## 2014-06-20 DIAGNOSIS — I779 Disorder of arteries and arterioles, unspecified: Secondary | ICD-10-CM

## 2014-06-20 DIAGNOSIS — I739 Peripheral vascular disease, unspecified: Secondary | ICD-10-CM

## 2014-06-20 DIAGNOSIS — E063 Autoimmune thyroiditis: Secondary | ICD-10-CM

## 2014-06-20 DIAGNOSIS — I519 Heart disease, unspecified: Secondary | ICD-10-CM

## 2014-06-20 LAB — PROTIME-INR
INR: 2.06 — AB (ref 0.00–1.49)
Prothrombin Time: 23.2 seconds — ABNORMAL HIGH (ref 11.6–15.2)

## 2014-06-20 MED ORDER — CARVEDILOL 3.125 MG PO TABS: 3.1250 mg | ORAL_TABLET | Freq: Two times a day (BID) | ORAL | Status: AC

## 2014-06-20 MED ORDER — WARFARIN SODIUM 7.5 MG PO TABS
7.5000 mg | ORAL_TABLET | Freq: Once | ORAL | Status: AC
  Administered 2014-06-20: 7.5 mg via ORAL
  Filled 2014-06-20 (×2): qty 1

## 2014-06-20 NOTE — Progress Notes (Signed)
Subjective: Patient has felt steady while walking and has been ambulating appropriately. States she will continue ambulating around room with walker. She complains of a headache this morning and continues to be sore around her chest cavity from the fall. She received Tylenol, and states that it has provided relief.  She has had no complaints of chest pain, shortness of breath, or lightheadedness. She is eager to go home, although she is happy with the medical care she has received.   Objective: Vital signs in last 24 hours: Filed Vitals:   06/19/14 1033 06/19/14 1224 06/19/14 2032 06/20/14 0455  BP: 126/52  93/57 102/62  Pulse: 70  71 59  Temp:   98 F (36.7 C) 97.6 F (36.4 C)  TempSrc:   Oral Oral  Resp:   12 15  Height:      Weight:    68.584 kg (151 lb 3.2 oz)  SpO2:  96% 94% 94%   Weight change: -1.86 kg (-4 lb 1.6 oz)  Constitutional: Sitting up in bed with a soft c-spine collar in place, eating breakfast HEENT:  Head: Normocephalic, soft c-spine collar in place Eyes: Conjunctivae are non-erythematous. EOMi  Cardiovascular: Regular rate and rhythm. No audible murmur. Bilateral carotid bruits audible on auscultation, more significant on left  Pulmonary/Chest: Normal respiratory effort, CTAB with no wheezes or crackles.  Abdominal: Soft. No abdominal distension or tenderness.  Neurological: She is alert and oriented to person, place, and time. CN II-XII grossly intact. Coordination normal.  Ext: no LE edema, radial pulses diminished bilaterally  Lab Results: Basic Metabolic Panel:  Recent Labs Lab 06/16/14 1215 06/16/14 2356  NA 140 142  K 3.8 3.7  CL 102 104  CO2 24 24  GLUCOSE 103* 100*  BUN 20 18  CREATININE 0.88 0.88  CALCIUM 9.2 9.0   Liver Function Tests:  Recent Labs Lab 06/16/14 1215 06/16/14 2356  AST 28 20  ALT 21 14  ALKPHOS 143* 120*  BILITOT 0.4 0.5  PROT 6.8 6.1  ALBUMIN 3.8 3.3*   CBC:  Recent Labs Lab 06/16/14 1215  WBC 5.9    NEUTROABS 3.0  HGB 11.8*  HCT 35.5*  MCV 87.9  PLT 182   Cardiac Enzymes:  Recent Labs Lab 06/16/14 1815 06/16/14 2356  TROPONINI <0.30 <0.30   CBG:  Recent Labs Lab 06/16/14 1244  GLUCAP 96   Thyroid Function Tests:  Recent Labs Lab 06/16/14 1830  TSH 2.950   Coagulation:  Recent Labs Lab 06/17/14 0940 06/18/14 0742 06/19/14 0539 06/20/14 0635  LABPROT 16.2* 20.0* 19.6* 23.2*  INR 1.30 1.70* 1.66* 2.06*   Studies/Results: Nm Myocar Multi W/spect W/wall Motion / Ef  06/18/2014   CLINICAL DATA:  Chest pain.  Syncope.  Palpitations.  EXAM: MYOCARDIAL IMAGING WITH SPECT (REST AND PHARMACOLOGIC-STRESS)  GATED LEFT VENTRICULAR WALL MOTION STUDY  LEFT VENTRICULAR EJECTION FRACTION  TECHNIQUE: Standard myocardial SPECT imaging was performed after resting intravenous injection of 10 mCi Tc-51m sestamibi. Subsequently, intravenous infusion of Lexiscan was performed under the supervision of the Cardiology staff. At peak effect of the drug, 30 mCi Tc-21m sestamibi was injected intravenously and standard myocardial SPECT imaging was performed. Quantitative gated imaging was also performed to evaluate left ventricular wall motion, and estimate left ventricular ejection fraction.  COMPARISON:  Chest radiograph 06/16/2014.  FINDINGS: Normal myocardial perfusion on both breast and stress phases. Ejection fraction was 88%. End-diastolic volume is 38 mm. End systolic volume is 5 mL. Wall motion and thickening is normal.  IMPRESSION:  1. No reversible ischemia. 2. Calculated ejection fraction 88%. Ejection fraction is likely overestimated by study.   Electronically Signed   By: Andreas NewportGeoffrey  Lamke M.D.   On: 06/18/2014 12:59   Medications: I have reviewed the patient's current medications. Scheduled Meds: . aspirin EC  81 mg Oral Daily  . atorvastatin  80 mg Oral q1800  . calcium carbonate  1 tablet Oral Q breakfast  . carvedilol  3.125 mg Oral BID WC  . colesevelam  1,250 mg Oral Daily  .  vitamin B-12  500 mcg Oral Daily  . ezetimibe  10 mg Oral Daily  . ferrous sulfate  325 mg Oral Once per day on Mon Thu  . irbesartan  300 mg Oral Daily  . omega-3 acid ethyl esters  2 g Oral BID  . pantoprazole  40 mg Oral Daily  . sodium chloride  3 mL Intravenous Q12H  . sodium chloride  3 mL Intravenous Q12H  . Warfarin - Pharmacist Dosing Inpatient   Does not apply q1800   Continuous Infusions:  PRN Meds:.sodium chloride, acetaminophen, acetaminophen, albuterol, sodium chloride Assessment/Plan: Principal Problem:   Syncope Active Problems:   CAD (coronary artery disease)   PAD (peripheral artery disease)   HTN (hypertension)   Carotid artery disease   GERD (gastroesophageal reflux disease)   Anemia   Enlarged thyroid gland   Hashimoto's thyroiditis  A: Ana Chapman is a 79 yo woman with cardiomegaly, 80-99% left carotid artery stenosis, and PVD admitted after a syncopal event.   Syncope: Etiology unclear. Cardiac etiology still possible, although cardiac workup has been negative. Vasovagal syncope also possible. She has had no neurologic deficits on physical exam and CT head was negative for intracranial process. Troponin x3 negative, EKG with no changes. Nuclear stress test was negative with 2D echo with EF 60-65% and grade 1 diastolic dysfunction. Cardiology has recommended for patient to use cane or walker, which patient has at home.  -Telemetry has been discontinued  Carotid artery disease: Carotid Dopplers showed 1-39% right ICA stenosis and 80%-99% left ICA stenosis. Bilateral vertebral artery flow is antegrade. Vascular Surgery was consulted, and they do not think the patients syncopal episode is related to her left ICA stenosis. Recommended elective carotid endarterectomy vs. stenting but to follow up with her est. vascular surgeon, Dr. Sondra Comeruz, and further discuss this with him.   Cervical spine pain: CT cervical spine negative for fractures or subluxation. Flexion/extension  cervical spine x-ray negative for soft tissue injury/fracture. Patient is now wearing a c-spine soft collar, although this is for comfort only.  -Follow up with her PCP  -Tylenol PRN for pain   Cardiomegaly with grade 1 diastolic dysfunction: Nuclear stress test and 2D echo unremarkable as discussed above.  -Continue home ASA 81mg , statin, and carvedilol   HTN: Resolved. 180/67 on admission, now 102/62. Patient's carvedilol and HCTZ were reduced yesterday to avoid hypotensive episodes. Her BP still dropped to 93/57 yesterday evening, however, she was asymptomatic. -Discontinue HCTZ 12.5mg  daily, patient has f/u appt with PCP next Friday and can reassess patients BP meds. -Continue Carvedilol 3.125 BID  -Continue Irbesartan 300mg  PO daily   Hashimoto's Thyroiditis: CT cervical spine showed diffusely enlarged thyroid gland with multiple bilateral indeterminate thyroid nodules. TSH level was normal on 8/4 at 2.950. TPO antibody level was high at 2774. Patient has reported palpitations, chest pain, and SOB in the past.  -Will ask PCP to set her up with [place]-up FNA and/or Thyroid  u/s.   PVD: She reports having lower extremity stents. She is on Warfarin. INR was 1.5 on presentation, but has become therapeutic at 2.06. She has a coumadin check scheduled for next week.  -Warfarin per pharmacy   FEN/GI:  Heart healthy  Protonix 40mg  daily   Dispo: Patient's acute medical issues have resolved. Patient will be discharged today. Follow up appointment with PCP and Coumadin clinic has been made for next week.   This is a Psychologist, occupational Note.  The care of the patient was discussed with Dr. Garald Braver and the assessment and plan formulated with their assistance.  Please see their attached note for official documentation of the daily encounter.   LOS: 4 days   Ana Chapman, Med Student 06/20/2014, 7:59 AM

## 2014-06-20 NOTE — Progress Notes (Signed)
ANTICOAGULATION CONSULT NOTE - Follow Up Consult  Pharmacy Consult for coumadin Indication: hx PVD  Allergies  Allergen Reactions  . Contrast Media [Iodinated Diagnostic Agents] Itching, Rash and Other (See Comments)    Skin was peeling   . Latex Itching and Rash    Patient Measurements: Height: 4\' 8"  (142.2 cm) Weight: 151 lb 3.2 oz (68.584 kg) IBW/kg (Calculated) : 36.3   Vital Signs: Temp: 97.6 F (36.4 C) (08/08 0455) Temp src: Oral (08/08 0455) BP: 110/60 mmHg (08/08 0902) Pulse Rate: 60 (08/08 0902)  Labs:  Recent Labs  06/18/14 0742 06/19/14 0539 06/20/14 0635  LABPROT 20.0* 19.6* 23.2*  INR 1.70* 1.66* 2.06*    Estimated Creatinine Clearance: 41.6 ml/min (by C-G formula based on Cr of 0.88).  Assessment: Patient is a [age] yof on coumadin for hx PVD. INR therapeutic 2.06 today (previously subtherapeutic 1.66 on 8/7), no CBC. No bleeding documented.   PTA: 7.5mg  daily except 5mg  on MWF per coumadin clinic.  Goal of Therapy:  INR 2-3  Plan:  -Repeat Coumadin 7.5mg  (PTA dose 7.5mg  qd except 5mg  MWF)- INR now therapeutic -Daily PT/INR -Monitor for bleeding  Babs BertinHaley Janey Petron, PharmD Clinical Pharmacist - Resident Pager [phone] 06/20/2014 11:58 AM

## 2014-06-20 NOTE — Progress Notes (Signed)
Patient discharge teaching given, including activity, diet, follow-up appoints, and medications. Patient verbalized understanding of all discharge instructions. IV access was d/c'd. Vitals are stable. Skin is intact except as charted in most recent assessments. Pt to be escorted out by NT, to be driven home by taxi.  

## 2014-06-20 NOTE — Progress Notes (Signed)
Pt. Is refusing all alarms (bed and chair). States she can do as she pleases and walk around her room all she wants unassisted because her doctors told her that she could. Pt refuses to call for help when ambulating and getting up from bed and chair. Educated patient on why the alarms and calling for help are beneficial.

## 2014-06-20 NOTE — Progress Notes (Addendum)
Patient provided with taxi voucher home. She is [place] and states that she feels safe to travel via taxi. Voucher provided to RN to call when patient is ready for d/c.  Samuella BruinKristin Yulia Ulrich, MSW, [employer]CSWA Clinical Social Worker [facility] [phone]

## 2014-06-20 NOTE — Progress Notes (Signed)
I have seen the patient and reviewed the daily progress note by Lillia Carmel MS 4 and discussed the care of the patient with them.  See below for documentation of my findings, assessment, and plans.  Subjective: No overnight events. She had no dizziness, chills, chest pain, DOE, or syncope.   Objective: Vital signs in last 24 hours: Filed Vitals:   06/20/14 0455 06/20/14 0902 06/20/14 1351 06/20/14 1601  BP: 102/62 110/60 146/68 118/74  Pulse: 59 60 67   Temp: 97.6 F (36.4 C)  97.6 F (36.4 C)   TempSrc: Oral  Oral   Resp: 15  18   Height:      Weight: 151 lb 3.2 oz (68.584 kg)     SpO2: 94%  97%    Weight change: -4 lb 1.6 oz (-1.86 kg) No intake or output data in the 24 hours ending 06/20/14 2153 Vitals reviewed. General: resting in bed, in NAD HEENT: no scleral icterus. Soft c-collar in place Cardiac: RRR, no rubs, murmurs or gallops Pulm: clear to auscultation bilaterally, no wheezes, rales, or rhonchi Abd: soft, nontender, nondistended, BS present Ext: warm and well perfused, no pedal edema Neuro: alert and oriented X3, moves all extremities voluntairly  Lab Results: Reviewed and documented in Electronic Record Micro Results: Reviewed and documented in Electronic Record Studies/Results: Reviewed and documented in Electronic Record Medications: I have reviewed the patient's current medications. Scheduled Meds: Continuous Infusions: PRN Meds:.   Assessment/Plan: 79 yo woman with cardiomegaly, 80-99% left carotid artery stenosis, and PVD admitted after a syncopal event.   Syncope: Etiology unclear. Cardiac etiology still possible, although cardiac workup has been negative. Vasovagal syncope also possible. She has had no neurologic deficits on physical exam and CT head was negative for intracranial process. Troponin x3 negative, EKG with no changes. Nuclear stress test was negative with 2D echo with EF 60-65% and grade 1 diastolic dysfunction. Cardiology has  recommended for patient to use cane or walker, which patient has at home.  -Telemetry has been discontinued   Carotid artery disease: Carotid Dopplers showed 1-39% right ICA stenosis and 80%-99% left ICA stenosis. Bilateral vertebral artery flow is antegrade. Vascular Surgery was consulted, and they do not think the patients syncopal episode is related to her left ICA stenosis. Recommended elective carotid endarterectomy vs. stenting but to follow up with her est. vascular surgeon, Dr. Sondra Come, and further discuss this with him.   Cervical spine pain: CT cervical spine negative for fractures or subluxation. Flexion/extension cervical spine x-ray negative for soft tissue injury/fracture. Patient is now wearing a c-spine soft collar, although this is for comfort only.  -Follow up with her PCP  -Tylenol PRN for pain   Cardiomegaly with grade 1 diastolic dysfunction: Nuclear stress test and 2D echo unremarkable as discussed above.  -Continue home ASA 81mg , statin, and carvedilol   HTN: Resolved. 180/67 on admission, now 102/62. Patient's carvedilol and HCTZ were reduced yesterday to avoid hypotensive episodes. Her BP still dropped to 93/57 yesterday evening, however, she was asymptomatic.  -Discontinue HCTZ 12.5mg  daily, patient has f/u appt with PCP next Friday and can reassess patients BP meds.  -Continue Carvedilol 3.125 BID  -Continue Irbesartan 300mg  PO daily   Hashimoto's Thyroiditis: CT cervical spine showed diffusely enlarged thyroid gland with multiple bilateral indeterminate thyroid nodules. TSH level was normal on 8/4 at 2.950. TPO antibody level was high at 2774. Patient has reported palpitations, chest pain, and SOB in the past.  -Will ask PCP to set her  [facility] [place] follow-up FNA and/or Thyroid u/s.   PVD: She reports having lower extremity stents. She is on Warfarin. INR was 1.5 on presentation, but has become therapeutic at 2.06. She has a coumadin check  scheduled for next week.  -Warfarin per pharmacy   FEN/GI:  Heart healthy  Protonix 40mg  daily   Dispo: Patient's acute medical issues have resolved. Patient will be discharged today. Follow up appointment with PCP and Coumadin clinic has been made for next week.   The patient does have a current PCP Darryl Lent(Amanda Taylor, PA-C) and [facility] after discharge.  The patient does have transportation limitations that hinder transportation to clinic appointments. She received cab voucher for her discharge.  .Services Needed at time of discharge: Y = Yes, Blank = No PT:   OT:   RN:   Equipment:   Other:     LOS: 4 days   Ky BarbanSolianny D Trung Wenzl, MD 06/20/2014, 9:53 PM

## 2014-06-22 NOTE — Progress Notes (Signed)
CARE MANAGEMENT NOTE 06/22/2014  Patient:  Ana Chapman,Ana Chapman   Account Number:  [id number]  Date Initiated:  06/18/2014  Documentation initiated by:  Letha CapeAYLOR,DEBORAH  Subjective/Objective Assessment:   dx fall  admit- lives alone     Action/Plan:   pt eval- no pt f/u   Anticipated DC Date:  06/19/2014   Anticipated DC Plan:  HOME/SELF CARE      DC Planning Services  CM consult      Choice offered to / List presented to:             Status of service:  Completed, signed off Medicare Important Message given?  YES (If response is "NO", the following Medicare IM given date fields will be blank) Date Medicare IM given:  06/19/2014 Medicare IM given by:  Letha CapeAYLOR,DEBORAH Date Additional Medicare IM given:   Additional Medicare IM given by:    Discharge Disposition:  HOME/SELF CARE  Per UR Regulation:  Reviewed for med. necessity/level of care/duration of stay  If discussed at Long Length of Stay Meetings, dates discussed:    Comments:  06/22/2014 1615 [facility] consultant, Anibal Hendersonim Henderson RN. Isidoro DonningAlesia Kiwan Gadsden RN CCM Case Mgmt phone [phone]  06/19/2014 1135 No NCM needs identified. Isidoro DonningAlesia Lessa Huge  RN CCM Case Mgmt phone [phone]

## 2014-06-22 NOTE — Discharge Summary (Signed)
Name: Ana Chapman MRN: [id number] DOB: Feb 02, 1935 79 y.o. PCP: Ana Lent, PA-C  Date of Admission: 06/16/2014 12:16 PM Date of Discharge: 06/20/2014 Attending Physician: Dr. Judyann Munson, MD Discharge Diagnosis: Principal Problem:   Syncope Active Problems:   CAD (coronary artery disease)   PAD (peripheral artery disease)   HTN (hypertension)   Carotid artery disease   GERD (gastroesophageal reflux disease)   Anemia   Enlarged thyroid gland   Hashimoto's thyroiditis  Discharge Medications:   Medication List    STOP taking these medications       hydrochlorothiazide 25 MG tablet  Commonly known as:  HYDRODIURIL     potassium chloride 10 MEQ tablet  Commonly known as:  K-DUR,KLOR-CON      TAKE these medications       alendronate 70 MG tablet  Commonly known as:  FOSAMAX  Take 70 mg by mouth once a week.     aspirin EC 81 MG tablet  Take 81 mg by mouth daily.     calcium carbonate 600 MG Tabs tablet  Commonly known as:  OS-CAL  Take 600 mg by mouth daily.     carvedilol 3.125 MG tablet  Commonly known as:  COREG  Take 1 tablet (3.125 mg total) by mouth 2 (two) times daily with a meal.     celecoxib 200 MG capsule  Commonly known as:  CELEBREX  Take 200 mg by mouth daily.     colesevelam 625 MG tablet  Commonly known as:  WELCHOL  Take 1,250 mg by mouth daily.     CRESTOR 40 MG tablet  Generic drug:  rosuvastatin  Take 40 mg by mouth 2 (two) times daily.     esomeprazole 40 MG capsule  Commonly known as:  NEXIUM  Take 40 mg by mouth daily at 12 noon.     ezetimibe 10 MG tablet  Commonly known as:  ZETIA  Take 10 mg by mouth daily.     ferrous sulfate 325 (65 FE) MG tablet  Take 325 mg by mouth 2 (two) times a week.     olmesartan 40 MG tablet  Commonly known as:  BENICAR  Take 40 mg by mouth daily.     omega-3 acid ethyl esters 1 G capsule  Commonly known as:  LOVAZA  Take 2 g by mouth 2 (two) times daily.     vitamin B-12 500 MCG  tablet  Commonly known as:  CYANOCOBALAMIN  Take 500 mcg by mouth daily.     Vitamin D (Ergocalciferol) 50000 UNITS Caps capsule  Commonly known as:  DRISDOL  Take 50,000 Units by mouth every 7 (seven) days.     VITAMIN LIQUID PO  Take 5 mLs by mouth daily.     warfarin 5 MG tablet  Commonly known as:  COUMADIN  Take 5-7.5 mg by mouth daily. Take 5mg  by mouth on Monday, Wednesday, and friday. Take 7.5mg  by mouth on Sunday, Tuesday,Thursday and Saturday.        Disposition and follow-up:   Ana Chapman was discharged from [facility] in Good condition.  At the hospital follow up visit please address:  1.  BP recheck as he discontinued her HCTZ.   2.  Labs / imaging needed at time of follow-up: BMET. INR check. Consider ordering thyroid ultrasound for further evaluation of likely Hashimoto's thyroiditis.    3.  Pending labs/ test needing follow-up: None  Follow-up Appointments:     Follow-up Information  Follow up with Ana Lent, PA-C On 06/26/2014. (11:30AM)    Specialty:  Physician Assistant   Contact information:   [address] [place] [address] [phone]       Follow up with [facility] On 06/25/2014. (2:00PM (Appointment is where the patient usually gets her Coumadin checked in [facility]))    Specialty:  Cardiology   Contact information:   [address] [place] [address] [phone]      Discharge Instructions: Discharge Instructions   Diet - low sodium heart healthy    Complete by:  As directed      Increase activity slowly    Complete by:  As directed            Consultations: Treatment Team:  Ana Libman, MD Cardiology, Dr. Algie Coffer, MD  Procedures Performed:  Ct Head Wo Contrast  06/16/2014   CLINICAL DATA:  Post fall, on Coumadin, now with confusion  EXAM: CT HEAD WITHOUT CONTRAST  CT CERVICAL SPINE WITHOUT CONTRAST  TECHNIQUE: Multidetector CT imaging of the head and  cervical spine was performed following the standard protocol without intravenous contrast. Multiplanar CT image reconstructions of the cervical spine were also generated.  COMPARISON:  Head CT - 07/10/2008  FINDINGS: CT HEAD FINDINGS  Advanced atrophy with diffuse sulcal prominence and centralized volume loss with mild commensurate ex vacuo dilatation of the ventricular system, likely progressed in the interval. Scattered minimal periventricular hypodensities compatible with microvascular ischemic disease. Bilateral basal ganglial calcifications. The gray-white differentiation is otherwise well maintained without CT evidence of acute large territory infarct. No intraparenchymal or extra-axial mass or hemorrhage. Normal configuration of the ventricles and basilar cisterns. No midline shift. There is under pneumatization of the bilateral frontal sinuses. The remaining paranasal sinuses and mastoid air cells are normally aerated. No air-fluid levels. Regional soft tissues appear normal. No displaced calvarial fracture.  CT CERVICAL SPINE FINDINGS  C1 to the superior endplate of T3 is imaged.  There is mild (approximately 2 mm) of anterolisthesis of C5 upon C6. The bilateral facets are normally aligned. The dens is normally positioned between the lateral masses of C1. Moderate degenerative change of the atlanto dental articulation. A tiny accessory ossicle is noted about the caudal aspect of the anterior arch of C1. Normal atlanto-axial articulations. Incidental note is made of partial ossification of the anterior longitudinal ligament anterior to the C2-C3 intervertebral disc space.  No fracture or static subluxation of the cervical spine. Cervical vertebral body heights are preserved. Prevertebral soft tissues are normal.  There is mild to moderate multilevel DDD throughout the cervical spine, likely worse at C4-C5 and C6-C7 with disc space height loss, endplate irregularity and sclerosis.  There is diffuse  enlargement of the thyroid gland with a suspected approximately 2.7 x 1.6 cm partially calcified left-sided thyroid nodule (image 54, series 301 as well as an ill-defined approximately 1.9 x 1.8 cm hypo attenuating nodule within the thyroid isthmus (image 61, series 302) and an ill-defined approximately 3.0 x 2.4 cm nodule within the right lobe of the thyroid (image 63, series 302).  Atherosclerotic plaque within the bilateral carotid bulbs, left greater than right. No bulky cervical adenopathy on this noncontrast examination. Limited visualization of lung apices is normal.  IMPRESSION: 1. Advanced atrophy and mild microvascular ischemic disease without acute intracranial process. 2. No fracture or static subluxation of the cervical spine. 3. Mild to moderate multilevel DDD throughout the cervical spine, likely worse at  C4-C5 and C6-C7. 4. Diffusely enlarged thyroid gland with multiple bilateral indeterminate thyroid nodules. Further evaluation with nonemergent thyroid ultrasound could be performed as clinically indicated.   Electronically Signed   By: Simonne Come M.D.   On: 06/16/2014 13:34   Ct Cervical Spine Wo Contrast  06/16/2014   CLINICAL DATA:  Post fall, on Coumadin, now with confusion  EXAM: CT HEAD WITHOUT CONTRAST  CT CERVICAL SPINE WITHOUT CONTRAST  TECHNIQUE: Multidetector CT imaging of the head and cervical spine was performed following the standard protocol without intravenous contrast. Multiplanar CT image reconstructions of the cervical spine were also generated.  COMPARISON:  Head CT - 07/10/2008  FINDINGS: CT HEAD FINDINGS  Advanced atrophy with diffuse sulcal prominence and centralized volume loss with mild commensurate ex vacuo dilatation of the ventricular system, likely progressed in the interval. Scattered minimal periventricular hypodensities compatible with microvascular ischemic disease. Bilateral basal ganglial calcifications. The gray-white differentiation is otherwise well maintained  without CT evidence of acute large territory infarct. No intraparenchymal or extra-axial mass or hemorrhage. Normal configuration of the ventricles and basilar cisterns. No midline shift. There is under pneumatization of the bilateral frontal sinuses. The remaining paranasal sinuses and mastoid air cells are normally aerated. No air-fluid levels. Regional soft tissues appear normal. No displaced calvarial fracture.  CT CERVICAL SPINE FINDINGS  C1 to the superior endplate of T3 is imaged.  There is mild (approximately 2 mm) of anterolisthesis of C5 upon C6. The bilateral facets are normally aligned. The dens is normally positioned between the lateral masses of C1. Moderate degenerative change of the atlanto dental articulation. A tiny accessory ossicle is noted about the caudal aspect of the anterior arch of C1. Normal atlanto-axial articulations. Incidental note is made of partial ossification of the anterior longitudinal ligament anterior to the C2-C3 intervertebral disc space.  No fracture or static subluxation of the cervical spine. Cervical vertebral body heights are preserved. Prevertebral soft tissues are normal.  There is mild to moderate multilevel DDD throughout the cervical spine, likely worse at C4-C5 and C6-C7 with disc space height loss, endplate irregularity and sclerosis.  There is diffuse enlargement of the thyroid gland with a suspected approximately 2.7 x 1.6 cm partially calcified left-sided thyroid nodule (image 54, series 301 as well as an ill-defined approximately 1.9 x 1.8 cm hypo attenuating nodule within the thyroid isthmus (image 61, series 302) and an ill-defined approximately 3.0 x 2.4 cm nodule within the right lobe of the thyroid (image 63, series 302).  Atherosclerotic plaque within the bilateral carotid bulbs, left greater than right. No bulky cervical adenopathy on this noncontrast examination. Limited visualization of lung apices is normal.  IMPRESSION: 1. Advanced atrophy and mild  microvascular ischemic disease without acute intracranial process. 2. No fracture or static subluxation of the cervical spine. 3. Mild to moderate multilevel DDD throughout the cervical spine, likely worse at C4-C5 and C6-C7. 4. Diffusely enlarged thyroid gland with multiple bilateral indeterminate thyroid nodules. Further evaluation with nonemergent thyroid ultrasound could be performed as clinically indicated.   Electronically Signed   By: Simonne Come M.D.   On: 06/16/2014 13:34   Nm Myocar Multi W/spect W/wall Motion / Ef  06/18/2014   CLINICAL DATA:  Chest pain.  Syncope.  Palpitations.  EXAM: MYOCARDIAL IMAGING WITH SPECT (REST AND PHARMACOLOGIC-STRESS)  GATED LEFT VENTRICULAR WALL MOTION STUDY  LEFT VENTRICULAR EJECTION FRACTION  TECHNIQUE: Standard myocardial SPECT imaging was performed after resting intravenous injection of 10 mCi Tc-17m sestamibi. Subsequently, intravenous infusion  of Lexiscan was performed under the supervision of the Cardiology staff. At peak effect of the drug, 30 mCi Tc-8515m sestamibi was injected intravenously and standard myocardial SPECT imaging was performed. Quantitative gated imaging was also performed to evaluate left ventricular wall motion, and estimate left ventricular ejection fraction.  COMPARISON:  Chest radiograph 06/16/2014.  FINDINGS: Normal myocardial perfusion on both breast and stress phases. Ejection fraction was 88%. End-diastolic volume is 38 mm. End systolic volume is 5 mL. Wall motion and thickening is normal.  IMPRESSION: 1. No reversible ischemia. 2. Calculated ejection fraction 88%. Ejection fraction is likely overestimated by study.   Electronically Signed   By: Andreas NewportGeoffrey  Lamke M.D.   On: 06/18/2014 12:59   Dg Chest Port 1 View  06/16/2014   CLINICAL DATA:  Recent traumatic injury with pain  EXAM: PORTABLE CHEST - 1 VIEW  COMPARISON:  09/17/2008  FINDINGS: The heart size and mediastinal contours are within normal limits. Both lungs are clear. The  visualized skeletal structures are unremarkable. An old left clavicular fracture is noted.  IMPRESSION: No active disease.   Electronically Signed   By: Alcide CleverMark  Lukens M.D.   On: 06/16/2014 14:29   Dg Cerv Spine Flex&ext Only  06/17/2014   CLINICAL DATA:  Whiplash.  EXAM: CERVICAL SPINE - FLEXION AND EXTENSION VIEWS ONLY  COMPARISON:  CT scan dated 06/16/2014  FINDINGS: There is no fracture or cyst abnormal subluxation with flexion or extension. There is slight degenerative narrowing of the C5-6 disc space. No prevertebral soft tissue swelling.  Diffuse osteopenia.  IMPRESSION: No acute abnormalities.  No abnormal subluxation.   Electronically Signed   By: Geanie CooleyJim  Maxwell M.D.   On: 06/17/2014 18:50    2D Echo:  06/18/14 Study Conclusions  - Left ventricle: The cavity size was normal. Systolic function was normal. The estimated ejection fraction was in the range of 60% to 65%. Wall motion was normal; there were no regional wall motion abnormalities. Doppler parameters are consistent with abnormal left ventricular relaxation (grade 1 diastolic dysfunction). - Aortic valve: There was trivial regurgitation. - Mitral valve: Calcified annulus.   Admission HPI:  Ms. Hart RochesterLawson is a 79 year old woman with PMH significant for cardiomegaly, 80% left carotid artery stenosis, and PVD s/p stent placement, on warfarin, who presented to the ED after a fall. She reports losing consciousness before the episode and does not recall the events preceding her fall. Per EMT notes, she was seen falling into the bus door and then backwards hitting her head on the concrete. She remembers being assisted by medics into the ambulance but was confused. She reports skipping breakfast this morning as she does most morning for lack of time. She had spend at least two hours on the bus ride and getting to the social security office before this event and had been in her usual state of health. She denies loss of bowel/bladder control, tongue  biting, history of seizures, or convulsions. She denies dizziness, lightheadedness, tunnel vision, and nausea prior to the event. She does report recent palpitations, shortness of breath, and chest tightening/pain, which is usually relieved with rest after 2-3 hours. She is independent of her ADLs and lives alone.  In the ED, CT head w/o contrast was negative for intracranial bleed, CT cervical spine was negative for fracture but she remained in a C-collar as she continued to complain of neck pain.   Hospital Course by problem list: Syncope: Likely due to orthostatic hypotension and she had fluctuations in her  blood pressure from sitting to standing position. She received IV fluid hydration and her home diuretic (HCTZ) was discontinued. She complained of no dizziness and had no presyncope or syncope during this hospitalization. She had no neurologic deficits on physical exam throughout her hospitalization and CT head was negative for intracranial processes. ACS was ruled out with troponin x3 was negative, daily EKGs showed no changes, and no abnormalities were noted on telemetry. She underwent a nuclear stress test on 8/6 that showed no reversible ischemia. Her 06/18/14 2D Echo showed no regional wall motion abnormalities and her EF was 60-65%. Bilateral carotid Dopplers was remarkable for 80-99% left carotid artery stenosis and 1-39% right carotid artery stenosis. Vascular Surgery was consulted but recommended continued outpatient follow up with Dr. Sondra Come for her left ICA stenosis as there was no evidence that this findings was the cause for her syncope. to b caused by her left carotid artery stenosis. Of note, she was evaluated by physical therapy and was found to be fully independent of her ADLs with no need for continued PT after her discharge. She will follow up with her PCP for volume status assessment and BP recheck while off the HCTZ.   Hashimoto's Thyroiditis: CT cervical spine incidentally showed  diffusely enlarged thyroid gland with multiple bilateral indeterminate thyroid nodules. TSH level was normal on 8/4 at 2.950. TPO antibody level were checked and high at 2774. She has reported palpitations, chest pain, and SOB in the past. She was advised to follow up with her PCP for follow-up Thyroid ultrasound and possible FNA aspiration and referral to Endocrinology if appropriate.   HTN: Her blood pressure was 180/67 on presentation and her home carvedilol 12.5mg  BID, Benicar 40mg  daily (substituted for irbesartan 300mg  daily for [facility] formulary,  and HCTZ 25mg  daily were continued. She had fluctuation of her BP consistent with orthostatic hypotension and her home carvedilol dose was decreased to 3.125mg  BID and her HCTZ 12.5mg  daily was discontinued. She also received IV fluid hydration and maintained stable BP. Patient's BP on discharge was 118/74. She will be discharge home on a new antihypertension regimen of Benicar 40mg  daily and Coreg 3.125 mg BID. She will follow up with her PCP for BP recheck.   Cervical spine pain: A CT cervical spine was negative for fractures and subluxation. She was placed in an immobilizing C-spine collar. Neurosurgery was consulted but did not formally see the patient but did recommend that she was switched to a soft c-collar for comfort after a cervical spine flexion/extension x-ray showed no abnormalities. She was advised to remove the soft collar for bathing and to discontinued wearing it once her pain subsides. She was advised to take Tylenol as needed for pain but to avoid NSAIDs.   PVD: She reports having lower extremity stents, which was confirmed by records from her vascular surgeon, Dr. Sondra Come. She is on Warfarin. Her presenting INR was 1.5 but gradually increased and was therapeutic at 2.06 on the day of her discharge. Pharmacy recommended follow-up coumadin check a week from discharge.     Discharge Vitals:   BP 118/74  Pulse 67  Temp(Src) 97.6 F (36.4 C)  (Oral)  Resp 18  Ht 4\' 8"  (1.422 m)  Wt 151 lb 3.2 oz (68.584 kg)  BMI 33.92 kg/m2  SpO2 97%  Discharge Labs:  Basic Metabolic Panel:   Recent Labs  Lab  06/16/14 1215  06/16/14 2356   NA  140  142   K  3.8  3.7  CL  102  104   CO2  24  24   GLUCOSE  103*  100*   BUN  20  18   CREATININE  0.88  0.88   CALCIUM  9.2  9.0    Liver Function Tests:   Recent Labs  Lab  06/16/14 1215  06/16/14 2356   AST  28  20   ALT  21  14   ALKPHOS  143*  120*   BILITOT  0.4  0.5   PROT  6.8  6.1   ALBUMIN  3.8  3.3*    CBC:   Recent Labs  Lab  06/16/14 1215   WBC  5.9   NEUTROABS  3.0   HGB  11.8*   HCT  35.5*   MCV  87.9   PLT  182    Cardiac Enzymes:   Recent Labs  Lab  06/16/14 1815  06/16/14 2356   TROPONINI  <0.30  <0.30    CBG:   Recent Labs  Lab  06/16/14 1244   GLUCAP  96    Thyroid Function Tests:   Recent Labs  Lab  06/16/14 1830   TSH  2.950    Coagulation:   Recent Labs  Lab  06/17/14 0940  06/18/14 0742  06/19/14 0539  06/20/14 0635   LABPROT  16.2*  20.0*  19.6*  23.2*   INR  1.30  1.70*  1.66*  2.06*      Signed: Ky Barban, MD 06/21/2014, 11:11 AM   Services Ordered on Discharge: None Equipment Ordered on Discharge: None

## 2020-08-13 DEATH — deceased

## 2023-01-24 ENCOUNTER — Other Ambulatory Visit: Payer: Self-pay | Admitting: Family Medicine
